# Patient Record
Sex: Male | Born: 1955 | Race: Black or African American | Hispanic: No | Marital: Married | Smoking: Former smoker
Health system: Southern US, Community
[De-identification: ages and names within clinical notes are randomized; demographics above are authoritative.]

## PROBLEM LIST (undated history)

## (undated) ENCOUNTER — Emergency Department (HOSPITAL_COMMUNITY): Admission: EM | Payer: Self-pay | Source: Home / Self Care

## (undated) DIAGNOSIS — E785 Hyperlipidemia, unspecified: Secondary | ICD-10-CM

## (undated) DIAGNOSIS — I10 Essential (primary) hypertension: Secondary | ICD-10-CM

## (undated) DIAGNOSIS — M1990 Unspecified osteoarthritis, unspecified site: Secondary | ICD-10-CM

## (undated) DIAGNOSIS — I639 Cerebral infarction, unspecified: Secondary | ICD-10-CM

---

## 2003-10-24 ENCOUNTER — Other Ambulatory Visit: Payer: Self-pay

## 2004-02-29 ENCOUNTER — Other Ambulatory Visit: Payer: Self-pay

## 2004-04-19 ENCOUNTER — Other Ambulatory Visit: Payer: Self-pay

## 2004-04-20 ENCOUNTER — Inpatient Hospital Stay: Payer: Self-pay | Admitting: Internal Medicine

## 2004-05-07 ENCOUNTER — Ambulatory Visit: Payer: Self-pay | Admitting: Internal Medicine

## 2004-12-27 ENCOUNTER — Emergency Department: Payer: Self-pay | Admitting: Unknown Physician Specialty

## 2005-07-04 ENCOUNTER — Inpatient Hospital Stay: Payer: Self-pay | Admitting: Unknown Physician Specialty

## 2005-07-04 ENCOUNTER — Other Ambulatory Visit: Payer: Self-pay

## 2006-01-08 ENCOUNTER — Emergency Department: Payer: Self-pay | Admitting: Emergency Medicine

## 2006-06-08 ENCOUNTER — Other Ambulatory Visit: Payer: Self-pay

## 2006-06-08 ENCOUNTER — Emergency Department: Payer: Self-pay | Admitting: General Practice

## 2006-06-28 ENCOUNTER — Ambulatory Visit: Payer: Self-pay | Admitting: Family Medicine

## 2006-08-02 ENCOUNTER — Ambulatory Visit: Payer: Self-pay | Admitting: General Surgery

## 2007-08-05 ENCOUNTER — Ambulatory Visit: Payer: Self-pay | Admitting: Urology

## 2008-01-15 ENCOUNTER — Other Ambulatory Visit: Payer: Self-pay

## 2008-01-15 ENCOUNTER — Ambulatory Visit: Payer: Self-pay | Admitting: Unknown Physician Specialty

## 2008-01-21 ENCOUNTER — Ambulatory Visit: Payer: Self-pay | Admitting: Unknown Physician Specialty

## 2008-08-20 ENCOUNTER — Ambulatory Visit: Payer: Self-pay | Admitting: Family Medicine

## 2009-07-18 ENCOUNTER — Emergency Department: Payer: Self-pay | Admitting: Internal Medicine

## 2009-11-19 ENCOUNTER — Ambulatory Visit: Payer: Self-pay | Admitting: Surgery

## 2009-11-26 ENCOUNTER — Ambulatory Visit: Payer: Self-pay | Admitting: Surgery

## 2010-03-25 ENCOUNTER — Ambulatory Visit: Payer: Self-pay | Admitting: Family Medicine

## 2010-07-27 ENCOUNTER — Emergency Department: Payer: Self-pay | Admitting: Emergency Medicine

## 2010-08-24 ENCOUNTER — Emergency Department (HOSPITAL_COMMUNITY)
Admission: EM | Admit: 2010-08-24 | Discharge: 2010-08-24 | Disposition: A | Discharge status: Home / Self Care | Payer: Self-pay | Attending: Emergency Medicine | Admitting: Emergency Medicine

## 2010-08-24 ENCOUNTER — Emergency Department (HOSPITAL_COMMUNITY): Payer: Self-pay

## 2010-08-24 ENCOUNTER — Encounter (HOSPITAL_COMMUNITY): Payer: Self-pay | Admitting: Radiology

## 2010-08-24 DIAGNOSIS — I10 Essential (primary) hypertension: Secondary | ICD-10-CM | POA: Insufficient documentation

## 2010-08-24 DIAGNOSIS — R1013 Epigastric pain: Secondary | ICD-10-CM | POA: Insufficient documentation

## 2010-08-24 DIAGNOSIS — K2970 Gastritis, unspecified, without bleeding: Secondary | ICD-10-CM | POA: Insufficient documentation

## 2010-08-24 DIAGNOSIS — Z8679 Personal history of other diseases of the circulatory system: Secondary | ICD-10-CM | POA: Insufficient documentation

## 2010-08-24 HISTORY — DX: Essential (primary) hypertension: I10

## 2010-08-24 LAB — POCT I-STAT, CHEM 8
BUN: 11 mg/dL (ref 6–23)
Calcium, Ion: 1.07 mmol/L — ABNORMAL LOW (ref 1.12–1.32)
Chloride: 101 mEq/L (ref 96–112)
Potassium: 4 mEq/L (ref 3.5–5.1)
Sodium: 138 mEq/L (ref 135–145)

## 2010-08-24 LAB — DIFFERENTIAL
Eosinophils Absolute: 0.2 10*3/uL (ref 0.0–0.7)
Lymphs Abs: 1.8 10*3/uL (ref 0.7–4.0)
Neutrophils Relative %: 40 % — ABNORMAL LOW (ref 43–77)

## 2010-08-24 LAB — POCT CARDIAC MARKERS
CKMB, poc: <1 ng/mL — ABNORMAL LOW (ref 1.0–8.0)
Myoglobin, poc: 54.8 ng/mL (ref 12–200)
Troponin i, poc: <0.05 ng/mL (ref 0.00–0.09)

## 2010-08-24 LAB — COMPREHENSIVE METABOLIC PANEL
AST: 30 U/L (ref 0–37)
Albumin: 3.9 g/dL (ref 3.5–5.2)
Calcium: 9.5 mg/dL (ref 8.4–10.5)
Creatinine, Ser: 1.11 mg/dL (ref 0.4–1.5)
GFR calc Af Amer: >60 mL/min (ref >60)
Total Protein: 6.8 g/dL (ref 6.0–8.3)

## 2010-08-24 LAB — URINALYSIS, ROUTINE W REFLEX MICROSCOPIC
Hgb urine dipstick: NEGATIVE
Specific Gravity, Urine: 1.015 (ref 1.005–1.030)
Urine Glucose, Fasting: NEGATIVE mg/dL

## 2010-08-24 LAB — CBC
MCV: 85.9 fL (ref 78.0–100.0)
Platelets: 243 10*3/uL (ref 150–400)
RBC: 5.18 MIL/uL (ref 4.22–5.81)
WBC: 4.2 10*3/uL (ref 4.0–10.5)

## 2010-08-24 MED ORDER — IOHEXOL 300 MG/ML  SOLN: 100.0000 mL | Freq: Once | INTRAMUSCULAR | Status: DC | PRN

## 2013-07-17 DIAGNOSIS — I639 Cerebral infarction, unspecified: Secondary | ICD-10-CM

## 2013-07-17 HISTORY — DX: Cerebral infarction, unspecified: I63.9

## 2014-07-03 DIAGNOSIS — I10 Essential (primary) hypertension: Secondary | ICD-10-CM | POA: Insufficient documentation

## 2014-07-03 DIAGNOSIS — E782 Mixed hyperlipidemia: Secondary | ICD-10-CM | POA: Insufficient documentation

## 2015-10-24 ENCOUNTER — Encounter: Payer: Self-pay | Admitting: *Deleted

## 2015-10-24 ENCOUNTER — Emergency Department
Admission: EM | Admit: 2015-10-24 | Discharge: 2015-10-24 | Disposition: A | Discharge status: Home / Self Care | Payer: PRIVATE HEALTH INSURANCE | Attending: Emergency Medicine | Admitting: Emergency Medicine

## 2015-10-24 DIAGNOSIS — X500XXA Overexertion from strenuous movement or load, initial encounter: Secondary | ICD-10-CM | POA: Insufficient documentation

## 2015-10-24 DIAGNOSIS — M25511 Pain in right shoulder: Secondary | ICD-10-CM | POA: Diagnosis present

## 2015-10-24 DIAGNOSIS — S46811A Strain of other muscles, fascia and tendons at shoulder and upper arm level, right arm, initial encounter: Secondary | ICD-10-CM | POA: Diagnosis not present

## 2015-10-24 DIAGNOSIS — Y990 Civilian activity done for income or pay: Secondary | ICD-10-CM | POA: Insufficient documentation

## 2015-10-24 DIAGNOSIS — I10 Essential (primary) hypertension: Secondary | ICD-10-CM | POA: Diagnosis not present

## 2015-10-24 DIAGNOSIS — Y9389 Activity, other specified: Secondary | ICD-10-CM | POA: Diagnosis not present

## 2015-10-24 DIAGNOSIS — Y929 Unspecified place or not applicable: Secondary | ICD-10-CM | POA: Insufficient documentation

## 2015-10-24 MED ORDER — NAPROXEN 500 MG PO TABS: 500.0000 mg | ORAL_TABLET | Freq: Two times a day (BID) | ORAL | Status: DC

## 2015-10-24 NOTE — ED Notes (Signed)
States he heard a pop in his right shoulder and is now having neck and shoulder tightness

## 2015-10-24 NOTE — ED Provider Notes (Signed)
[facility] Emergency Department Provider Note  ____________________________________________  Time seen: On arrival  I have reviewed the triage vital signs and the nursing notes.   HISTORY  Chief Complaint Neck Pain and Shoulder Pain    HPI Ronald Lang is a 60 y.o. male who presents with complaints of right shoulder and neck discomfort. 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 He complains the pain is worse when he rotates his head to the right area and he is able to move his shoulder without difficulty. No fevers or chills. No rash. No neuro deficits. No tingling or numbness.No chest pain    Past Medical History  Diagnosis Date  . Hypertension     There are no active problems to display for this patient.   History reviewed. No pertinent past surgical history.  Current Outpatient Rx  Name  Route  Sig  Dispense  Refill  . naproxen (NAPROSYN) 500 MG tablet   Oral   Take 1 tablet (500 mg total) by mouth 2 (two) times daily with a meal.   20 tablet   2     Allergies Review of patient's allergies indicates no known allergies.  History reviewed. No pertinent family history.  Social History Social History  Substance Use Topics  . Smoking status: None  . Smokeless tobacco: None  . Alcohol Use: None    Review of Systems  Constitutional: Negative for fever.      Musculoskeletal: Negative for back pain. Skin: Negative for rash. Neurological: Negative for headaches or focal weakness   ____________________________________________   PHYSICAL EXAM:  VITAL SIGNS: ED Triage Vitals  Enc Vitals Group     BP 10/24/15 0923 141/75 mmHg     Pulse Rate 10/24/15 0923 67     Resp 10/24/15 0923 18     Temp 10/24/15 0923 98 F (36.7 C)     Temp Source 10/24/15 0923 Oral     SpO2 10/24/15 0923 97 %     Weight 10/24/15 0923 209 lb (94.802 kg)     Height 10/24/15 0923 5\' 11"  (1.803 m)     Head  Cir --      Peak Flow --      Pain Score 10/24/15 0924 8     Pain Loc --      Pain Edu? --      Excl. in [place]? --      Constitutional: Alert and oriented. Well appearing and in no distress. Eyes: Conjunctivae are normal.  ENT   Head: Normocephalic and atraumatic.   Mouth/Throat: Mucous membranes are moist. Cardiovascular: Normal rate, regular rhythm.  Respiratory: Normal respiratory effort without tachypnea nor retractions.  Gastrointestinal: Soft and non-tender in all quadrants. No distention. There is no CVA tenderness. Musculoskeletal: Nontender with normal range of motion in all extremities. Patient with mild to moderate tenderness of the right central trapezius muscle consistent with muscle spasm/strain. Range of motion of the shoulder without difficulty. Neurologic:  Normal speech and language. No gross focal neurologic deficits are appreciated. Skin:  Skin is warm, dry and intact. No rash noted. Psychiatric: Mood and affect are normal. Patient exhibits appropriate insight and judgment.  ____________________________________________    LABS (pertinent positives/negatives)  Labs Reviewed - No data to display  ____________________________________________     ____________________________________________    RADIOLOGY I have personally reviewed any xrays that were ordered on this patient: None  ____________________________________________   PROCEDURES  Procedure(s) performed:  none   ____________________________________________   INITIAL IMPRESSION / ASSESSMENT AND PLAN / ED COURSE  Pertinent labs & imaging results that were available during my care of the patient were reviewed by me and considered in my medical decision making (see chart for details).  Patient overall well-appearing. Exam is most consistent with trapezius muscle strain. Recommend heating pad, NSAIDs. If no improvement he will follow up with orthopedics in 1 week. If worsening symptoms he  is to return to the emergency department  ____________________________________________   FINAL CLINICAL IMPRESSION(S) / ED DIAGNOSES  Final diagnoses:  Trapezius muscle strain, right, initial encounter     Lavonia Drafts, MD 10/24/15 1519

## 2015-10-24 NOTE — Discharge Instructions (Signed)

## 2016-05-03 ENCOUNTER — Emergency Department: Payer: No Typology Code available for payment source

## 2016-05-03 ENCOUNTER — Emergency Department
Admission: EM | Admit: 2016-05-03 | Discharge: 2016-05-03 | Disposition: A | Discharge status: Home / Self Care | Payer: No Typology Code available for payment source | Attending: Emergency Medicine | Admitting: Emergency Medicine

## 2016-05-03 ENCOUNTER — Encounter: Payer: Self-pay | Admitting: *Deleted

## 2016-05-03 DIAGNOSIS — Y92410 Unspecified street and highway as the place of occurrence of the external cause: Secondary | ICD-10-CM | POA: Diagnosis not present

## 2016-05-03 DIAGNOSIS — S134XXA Sprain of ligaments of cervical spine, initial encounter: Secondary | ICD-10-CM | POA: Insufficient documentation

## 2016-05-03 DIAGNOSIS — Y939 Activity, unspecified: Secondary | ICD-10-CM | POA: Diagnosis not present

## 2016-05-03 DIAGNOSIS — I10 Essential (primary) hypertension: Secondary | ICD-10-CM | POA: Diagnosis not present

## 2016-05-03 DIAGNOSIS — S199XXA Unspecified injury of neck, initial encounter: Secondary | ICD-10-CM | POA: Diagnosis present

## 2016-05-03 DIAGNOSIS — Y999 Unspecified external cause status: Secondary | ICD-10-CM | POA: Insufficient documentation

## 2016-05-03 MED ORDER — NAPROXEN 500 MG PO TABS: 500.0000 mg | ORAL_TABLET | Freq: Two times a day (BID) | ORAL | 2 refills | Status: DC
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
## 2016-05-03 NOTE — ED Notes (Signed)
Pt states understanding of discharge instructions. NAD noted at this time.
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
## 2016-07-15 ENCOUNTER — Emergency Department
Admission: EM | Admit: 2016-07-15 | Discharge: 2016-07-15 | Disposition: A | Discharge status: Home / Self Care | Payer: PRIVATE HEALTH INSURANCE | Attending: Student in an Organized Health Care Education/Training Program | Admitting: Student in an Organized Health Care Education/Training Program

## 2016-07-15 ENCOUNTER — Emergency Department: Payer: PRIVATE HEALTH INSURANCE

## 2016-07-15 DIAGNOSIS — I10 Essential (primary) hypertension: Secondary | ICD-10-CM | POA: Insufficient documentation

## 2016-07-15 DIAGNOSIS — Z79899 Other long term (current) drug therapy: Secondary | ICD-10-CM | POA: Diagnosis not present

## 2016-07-15 DIAGNOSIS — N5089 Other specified disorders of the male genital organs: Secondary | ICD-10-CM

## 2016-07-15 DIAGNOSIS — N50811 Right testicular pain: Secondary | ICD-10-CM | POA: Diagnosis present

## 2016-07-15 LAB — URINALYSIS, COMPLETE (UACMP) WITH MICROSCOPIC
BACTERIA UA: NONE SEEN
Bilirubin Urine: NEGATIVE
Glucose, UA: NEGATIVE mg/dL
Ketones, ur: NEGATIVE mg/dL
Nitrite: NEGATIVE
Protein, ur: NEGATIVE mg/dL
SPECIFIC GRAVITY, URINE: 1.015 (ref 1.005–1.030)
SQUAMOUS EPITHELIAL / LPF: NONE SEEN
pH: 5 (ref 5.0–8.0)

## 2016-07-15 LAB — CBC
HEMATOCRIT: 44.4 % (ref 40.0–52.0)
HEMOGLOBIN: 15.3 g/dL (ref 13.0–18.0)
MCH: 30.8 pg (ref 26.0–34.0)
MCHC: 34.5 g/dL (ref 32.0–36.0)
MCV: 89.1 fL (ref 80.0–100.0)
Platelets: 259 10*3/uL (ref 150–440)
RBC: 4.99 MIL/uL (ref 4.40–5.90)
RDW: 13.5 % (ref 11.5–14.5)
WBC: 9.5 10*3/uL (ref 3.8–10.6)

## 2016-07-15 LAB — COMPREHENSIVE METABOLIC PANEL
ALBUMIN: 4.1 g/dL (ref 3.5–5.0)
ALK PHOS: 51 U/L (ref 38–126)
ALT: 14 U/L — AB (ref 17–63)
AST: 26 U/L (ref 15–41)
Anion gap: 8 (ref 5–15)
BILIRUBIN TOTAL: 1.9 mg/dL — AB (ref 0.3–1.2)
BUN: 10 mg/dL (ref 6–20)
CALCIUM: 9.2 mg/dL (ref 8.9–10.3)
CO2: 29 mmol/L (ref 22–32)
CREATININE: 0.96 mg/dL (ref 0.61–1.24)
Chloride: 99 mmol/L — ABNORMAL LOW (ref 101–111)
GFR calc Af Amer: >60 mL/min (ref >60)
GFR calc non Af Amer: >60 mL/min (ref >60)
GLUCOSE: 91 mg/dL (ref 65–99)
Potassium: 3.6 mmol/L (ref 3.5–5.1)
SODIUM: 136 mmol/L (ref 135–145)
TOTAL PROTEIN: 7.1 g/dL (ref 6.5–8.1)

## 2016-07-15 LAB — LACTATE DEHYDROGENASE: LDH: 177 U/L (ref 98–192)

## 2016-07-15 MED ORDER — HYDROCODONE-ACETAMINOPHEN 5-325 MG PO TABS
1.0000 | ORAL_TABLET | ORAL | 0 refills | Status: DC | PRN
Start: 2016-07-15 — End: 2016-08-08

## 2016-07-15 MED ORDER — LEVOFLOXACIN 500 MG PO TABS: 500.0000 mg | ORAL_TABLET | Freq: Every day | ORAL | 0 refills | Status: AC

## 2016-07-15 MED ORDER — LEVOFLOXACIN 500 MG PO TABS
500.0000 mg | ORAL_TABLET | Freq: Once | ORAL | Status: AC
  Administered 2016-07-15: 500 mg via ORAL
  Filled 2016-07-15: qty 1

## 2016-07-15 MED ORDER — IBUPROFEN 600 MG PO TABS
600.0000 mg | ORAL_TABLET | Freq: Once | ORAL | Status: AC
  Administered 2016-07-15: 600 mg via ORAL
  Filled 2016-07-15: qty 1

## 2016-07-15 NOTE — ED Notes (Signed)
ED Provider at bedside. 

## 2016-07-15 NOTE — ED Provider Notes (Signed)
[facility] Emergency Department Provider Note    First MD Initiated Contact with Patient 07/15/16 1425     (approximate)  I have reviewed the triage vital signs and the nursing notes.   HISTORY  Chief Complaint Testicle Pain    HPI JOZEF ZAHNOW is a 60 y.o. male presents with 2 days of right-sided testicular pain and swelling. Denies any dysuria. No measured fevers. Denies any trauma. Patient states that essentially active with his wife. Denies any urethral discharge.  Denies any hematuria.   Past Medical History:  Diagnosis Date  . Hypertension    [place]: mother with unknown cancer No past surgical history  There are no active problems to display for this patient.     Prior to Admission medications   Medication Sig Start Date End Date Taking? Authorizing Provider  naproxen (NAPROSYN) 500 MG tablet Take 1 tablet (500 mg total) by mouth 2 (two) times daily with a meal. 05/03/16   Lavonia Drafts, MD    Allergies Patient has no known allergies.    Social History Social History  Substance Use Topics  . Smoking status: Not on file  . Smokeless tobacco: Not on file  . Alcohol use Not on file    Review of Systems Patient denies headaches, rhinorrhea, blurry vision, numbness, shortness of breath, chest pain, edema, cough, abdominal pain, nausea, vomiting, diarrhea, dysuria, fevers, rashes or hallucinations unless otherwise stated above in HPI. ____________________________________________   PHYSICAL EXAM:  VITAL SIGNS: Vitals:   07/15/16 1119  BP: (!) 142/73  Pulse: 72  Resp: 18  Temp: 98.7 F (37.1 C)    Constitutional: Alert and oriented. Well appearing and in no acute distress. Eyes: Conjunctivae are normal. PERRL. EOMI. Head: Atraumatic. Nose: No congestion/rhinnorhea. Mouth/Throat: Mucous membranes are moist.  Oropharynx non-erythematous. Neck: No stridor. Painless ROM. No cervical spine tenderness to  palpation Hematological/Lymphatic/Immunilogical: No cervical lymphadenopathy. Cardiovascular: Normal rate, regular rhythm. Grossly normal heart sounds.  Good peripheral circulation. Respiratory: Normal respiratory effort.  No retractions. Lungs CTAB. Gastrointestinal: Soft and nontender. No distention. No abdominal bruits. No CVA tenderness. Genitourinary: right testicle is enlarged with ttp. No erythema Musculoskeletal: No lower extremity tenderness nor edema.  No joint effusions. Neurologic:  Normal speech and language. No gross focal neurologic deficits are appreciated. No gait instability. Skin:  Skin is warm, dry and intact. No rash noted. Psychiatric: Mood and affect are normal. Speech and behavior are normal.  ____________________________________________   LABS (all labs ordered are listed, but only abnormal results are displayed)  Results for orders placed or performed during the hospital encounter of 07/15/16 (from the past 24 hour(s))  CBC     Status: None   Collection Time: 07/15/16 11:25 AM  Result Value Ref Range   WBC 9.5 3.8 - 10.6 K/uL   RBC 4.99 4.40 - 5.90 MIL/uL   Hemoglobin 15.3 13.0 - 18.0 g/dL   HCT 44.4 40.0 - 52.0 %   MCV 89.1 80.0 - 100.0 fL   MCH 30.8 26.0 - 34.0 pg   MCHC 34.5 32.0 - 36.0 g/dL   RDW 13.5 11.5 - 14.5 %   Platelets 259 150 - 440 K/uL  Comprehensive metabolic panel     Status: Abnormal   Collection Time: 07/15/16 11:25 AM  Result Value Ref Range   Sodium 136 135 - 145 mmol/L   Potassium 3.6 3.5 - 5.1 mmol/L   Chloride 99 (L) 101 - 111 mmol/L   CO2 29 22 - 32 mmol/L  Glucose, Bld 91 65 - 99 mg/dL   BUN 10 6 - 20 mg/dL   Creatinine, Ser 0.96 0.61 - 1.24 mg/dL   Calcium 9.2 8.9 - 10.3 mg/dL   Total Protein 7.1 6.5 - 8.1 g/dL   Albumin 4.1 3.5 - 5.0 g/dL   AST 26 15 - 41 U/L   ALT 14 (L) 17 - 63 U/L   Alkaline Phosphatase 51 38 - 126 U/L   Total Bilirubin 1.9 (H) 0.3 - 1.2 mg/dL   GFR calc non Af Amer >60 >60 mL/min   GFR calc Af  Amer >60 >60 mL/min   Anion gap 8 5 - 15  Urinalysis, Complete w Microscopic     Status: Abnormal   Collection Time: 07/15/16 11:25 AM  Result Value Ref Range   Color, Urine YELLOW (A) YELLOW   APPearance CLEAR (A) CLEAR   Specific Gravity, Urine 1.015 1.005 - 1.030   pH 5.0 5.0 - 8.0   Glucose, UA NEGATIVE NEGATIVE mg/dL   Hgb urine dipstick SMALL (A) NEGATIVE   Bilirubin Urine NEGATIVE NEGATIVE   Ketones, ur NEGATIVE NEGATIVE mg/dL   Protein, ur NEGATIVE NEGATIVE mg/dL   Nitrite NEGATIVE NEGATIVE   Leukocytes, UA MODERATE (A) NEGATIVE   RBC / HPF 0-5 0 - 5 RBC/hpf   WBC, UA 6-30 0 - 5 WBC/hpf   Bacteria, UA NONE SEEN NONE SEEN   Squamous Epithelial / LPF NONE SEEN NONE SEEN   Mucous PRESENT    ____________________________________________  ____________________________________________  RADIOLOGY  I personally reviewed all radiographic images ordered to evaluate for the above acute complaints and reviewed radiology reports and findings.  These findings were personally discussed with the patient.  Please see medical record for radiology report. ____________________________________________   PROCEDURES  Procedure(s) performed:  Procedures    Critical Care performed: no ____________________________________________   INITIAL IMPRESSION / ASSESSMENT AND PLAN / ED COURSE  Pertinent labs & imaging results that were available during my care of the patient were reviewed by me and considered in my medical decision making (see chart for details).  DDX: mass, torsion, epidydimytis  ZECHERIAH HILLIER is a 60 y.o. who presents to the ED with right testicular pain and swelling. Ultrasound ordered out of triage due to concern for torsion shows evidence of right testicular mass. Mass effect is not within the testicle but just inferior. There is some clinical signs of epididymitis but the heterogenous mass more concerning for neoplasm according to radiology. Will consult  urology.  Clinical Course as of Jul 15 1432  Sat Jul 15, 2016  1432 I just spoke with Dr. Alyson Ingles of urology regarding the results of the [place].  He has requested AFP, LDH and HCG labs to be drawn and will help arrange close outpatient follow up.  [PR]    Clinical Course User Index [PR] Merlyn Lot, MD     ____________________________________________   FINAL CLINICAL IMPRESSION(S) / ED DIAGNOSES  Final diagnoses:  Testicular pain, right      NEW MEDICATIONS STARTED DURING THIS VISIT:  New Prescriptions   No medications on file     Note:  This document was prepared using Dragon voice recognition software and may include unintentional dictation errors.    Merlyn Lot, MD 07/15/16 [phone]

## 2016-07-15 NOTE — ED Notes (Signed)
Right testicle enlarged. Otherwise, all systems WNL.

## 2016-07-15 NOTE — Discharge Instructions (Signed)
Please follow up with Dr. Alyson Ingles (Urologist) by calling the office on Tuesday for early appointment next week.  Take antibiotics as directed.

## 2016-07-15 NOTE — ED Triage Notes (Signed)
States right sided testicle pain for 2 days, denies any problems urinating, denies any trauma, states some swelling, deneis any discoloration

## 2016-07-17 LAB — BETA HCG QUANT (REF LAB): Beta hCG, Tumor Marker: <1 m[IU]/mL (ref 0–3)

## 2016-07-17 LAB — URINE CULTURE: Culture: 60000 — AB

## 2016-07-17 LAB — AFP TUMOR MARKER: AFP-Tumor Marker: 2.4 ng/mL (ref 0.0–8.3)

## 2016-07-19 ENCOUNTER — Encounter: Payer: Self-pay | Admitting: Urology

## 2016-07-19 ENCOUNTER — Ambulatory Visit: Payer: Self-pay | Admitting: Urology

## 2016-07-20 ENCOUNTER — Other Ambulatory Visit: Payer: Self-pay | Admitting: Radiology

## 2016-07-20 ENCOUNTER — Encounter: Payer: Self-pay | Admitting: Urology

## 2016-07-20 ENCOUNTER — Ambulatory Visit (INDEPENDENT_AMBULATORY_CARE_PROVIDER_SITE_OTHER): Payer: PRIVATE HEALTH INSURANCE | Admitting: Urology

## 2016-07-20 VITALS — BP 132/73 | HR 65 | Ht 71.0 in | Wt 201.1 lb

## 2016-07-20 DIAGNOSIS — N509 Disorder of male genital organs, unspecified: Secondary | ICD-10-CM | POA: Diagnosis not present

## 2016-07-20 DIAGNOSIS — N5089 Other specified disorders of the male genital organs: Secondary | ICD-10-CM

## 2016-07-20 NOTE — Progress Notes (Signed)
07/20/2016 10:58 AM   Ronald Lang May 27, 1956 [id number]  Referring provider: [facility] [address] [place], [place] [address]  Chief Complaint  Patient presents with  . Testicle Pain    HPI: 61 year old male previously seen in the emergency room on 07/15/16 with several days of right testicular pain and swelling. He denies any precipitating trauma or associated symptoms including any urinary issues.  Scrotal ultrasound shows a concerning heterogeneous area measuring 4.2 x 2.9 x 3.4 cm within the testicle itself concerning for possible neoplasm. Tumor markers were obtained in the emergency room including AFP, beta-hCG, and LDH which are negative.  He denies a personal history of epididymoorchitis or urinary tract infections. He does not do testicular self exams regularly.  He denies any weight loss or bone pain. No night sweats.  The pain in his right testicle has essentially subsided and he now has only a residual mass.  Incidentally, urine culture from the emergency room grew 60,000 colonies of enterococcus. He was treated with Levaquin appropriately.  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  He takes no blood thinners or antiplatelet therapy.   PMH: Past Medical History:  Diagnosis Date  . Hypertension     Surgical History: No past surgical history on file.  Home Medications:  Allergies as of 07/20/2016   No Known Allergies     Medication List       Accurate as of 07/20/16 10:58 AM. Always use your most recent med list.          HYDROcodone-acetaminophen 5-325 MG tablet Commonly known as:  NORCO Take 1 tablet by mouth every 4 (four) hours as needed for moderate pain.   levofloxacin 500 MG tablet Commonly known as:  LEVAQUIN Take 1 tablet (500 mg total) by mouth daily.   naproxen 500 MG tablet Commonly known as:  NAPROSYN Take 1 tablet (500 mg total) by mouth 2 (two) times daily with a meal.       Allergies: No Known  Allergies  Family History: Family History  Problem Relation Age of Onset  . Prostate cancer Neg Hx   . Kidney cancer Neg Hx   . Bladder Cancer Neg Hx     Social History:  reports that he has been smoking.  He has never used smokeless tobacco. He reports that he does not drink alcohol or use drugs.  ROS: UROLOGY Frequent Urination?: No Hard to postpone urination?: No Burning/pain with urination?: No Get up at night to urinate?: No Leakage of urine?: No Urine stream starts and stops?: No Trouble starting stream?: No Do you have to strain to urinate?: No Blood in urine?: No Urinary tract infection?: No Sexually transmitted disease?: No Injury to kidneys or bladder?: No Painful intercourse?: No Weak stream?: No Erection problems?: No Penile pain?: No  Gastrointestinal Nausea?: No Vomiting?: No Indigestion/heartburn?: No Diarrhea?: No Constipation?: No  Constitutional Fever: No Night sweats?: No Weight loss?: Yes Fatigue?: No  Skin Skin rash/lesions?: No Itching?: No  Eyes Blurred vision?: No Double vision?: No  Ears/Nose/Throat Sore throat?: No Sinus problems?: No  Hematologic/Lymphatic Swollen glands?: No Easy bruising?: No  Cardiovascular Leg swelling?: No Chest pain?: No  Respiratory Cough?: No Shortness of breath?: No  Endocrine Excessive thirst?: No  Musculoskeletal Back pain?: No Joint pain?: No  Neurological Headaches?: No Dizziness?: No  Psychologic Depression?: No Anxiety?: No  Physical Exam: BP 132/73 (BP Location: Left Arm, Patient Position: Sitting, Cuff Size: Large)   Pulse 65  Ht 5\' 11"  (1.803 m)   Wt 201 lb 1.6 oz (91.2 kg)   BMI 28.05 kg/m   Constitutional:  Alert and oriented, No acute distress.  Accompanied by wife today. HEENT: [place] AT, moist mucus membranes.  Trachea midline, no masses. Cardiovascular: No clubbing, cyanosis, or edema. RRR. Respiratory: Normal respiratory effort, no increased work of breathing.  CTAB. GI: Abdomen is soft, nontender, nondistended, no abdominal masses GU: Circumcised phallus with orthotopic meatus. No penile discharge appreciated. Bilateral descended testicles, no scrotal skin changes or edema.  Left testicle and epididymis normal. Right testicle firm, nodular, with intratesticular lesion highly concerning for neoplasm, relatively nontender on exam. Skin: No rashes, bruises or suspicious lesions. Lymph: No cervical or inguinal adenopathy. Neurologic: Grossly intact, no focal deficits, moving all 4 extremities. Psychiatric: Normal mood and affect.  Laboratory Data: Lab Results  Component Value Date   WBC 9.5 07/15/2016   HGB 15.3 07/15/2016   HCT 44.4 07/15/2016   MCV 89.1 07/15/2016   PLT 259 07/15/2016    Lab Results  Component Value Date   CREATININE 0.96 07/15/2016   Component     Latest Ref Rng & Units 07/15/2016  Color, Urine     YELLOW YELLOW (A)  Appearance     CLEAR CLEAR (A)  Specific Gravity, Urine     1.005 - 1.030 1.015  pH     5.0 - 8.0 5.0  Glucose     NEGATIVE mg/dL NEGATIVE  Hgb urine dipstick     NEGATIVE SMALL (A)  Bilirubin Urine     NEGATIVE NEGATIVE  Ketones, ur     NEGATIVE mg/dL NEGATIVE  Protein     NEGATIVE mg/dL NEGATIVE  Nitrite     NEGATIVE NEGATIVE  Leukocytes, UA     NEGATIVE MODERATE (A)  RBC / HPF     0 - 5 RBC/hpf 0-5  WBC, UA     0 - 5 WBC/hpf 6-30  Bacteria, UA     NONE SEEN NONE SEEN  Squamous Epithelial / LPF     NONE SEEN NONE SEEN  Mucous      PRESENT   Urinalysis    Component Value Date/Time   COLORURINE YELLOW (A) 07/15/2016 1125   APPEARANCEUR CLEAR (A) 07/15/2016 1125   LABSPEC 1.015 07/15/2016 1125   PHURINE 5.0 07/15/2016 1125   GLUCOSEU NEGATIVE 07/15/2016 1125   HGBUR SMALL (A) 07/15/2016 1125   BILIRUBINUR NEGATIVE 07/15/2016 1125   KETONESUR NEGATIVE 07/15/2016 1125   PROTEINUR NEGATIVE 07/15/2016 1125   UROBILINOGEN 1.0 08/24/2010 0956   NITRITE NEGATIVE 07/15/2016 1125    LEUKOCYTESUR MODERATE (A) 07/15/2016 1125   Component     Latest Ref Rng & Units 07/15/2016  Specimen Description      URINE, CLEAN CATCH  Special Requests      NONE  Culture      60,000 COLONIES/mL ENTEROCOCCUS FAECALIS (A)  Report Status      07/17/2016 FINAL  Organism ID, Bacteria      ENTEROCOCCUS FAECALIS (A)    Pertinent Imaging: CLINICAL DATA:  Right testicular pain for 2 days with swelling  EXAM: ULTRASOUND OF SCROTUM  TECHNIQUE: Complete ultrasound examination of the testicles, epididymis, and other scrotal structures was performed.  COMPARISON:  None.  FINDINGS: Right testicle  Measurements: 3.7 x 2.3 x 3.1 cm. No intra testicular mass or microlithiasis visualized. Inferior to the right testicle within the right scrotum is a 4.2 x 2.9 x 3.4 cm heterogeneous area with color flow, the  area pain and swelling.  Left testicle  Measurements: 4.2 x 2.8 x 2.4 cm. No mass or microlithiasis visualized.  Right epididymis:  Normal in size and appearance.  Left epididymis:  Normal in size and appearance.  Hydrocele:  There are small bilateral hydroceles.  Varicocele:  None visualized.  IMPRESSION: Inferior to the right testicle within the right scrotum is a 4.2 x 2.9 x 3.4 cm masslike area with color flow, the area pain and swelling. Favor neoplasm, less likely hematoma given the extensive color flow.   Electronically Signed   By: Abelardo Diesel M.D.   On: 07/15/2016 12:19  Scrotal ultrasound personally reviewed today.  Assessment & Plan:  61 year old male with right testicular pain found to have a 4.2 cm lesion within the testicle highly concerning for neoplasm. Differential diagnosis includes focal epididymoorchitis, trouble with hemorrhage although these 2 etiologies are less likely. He did have a positive urine culture which was adequately treated, unclear if this was contributory to his symptoms.  1. Mass of right testicle  Given the  high index of suspicion for testicular cancer, I recommended proceeding with right inguinal approach radical orchiectomy. I explained that with possible that the lesion may represent alternative pathology is as above.  We reviewed the risks and benefits of orchiectomy. He was offered a prosthesis and declined. He is not interested in sperm banking at his age. Risks including bleeding, infection, phantom testicular pain, chronic pain, hematoma, discomfort were all discussed. All of his questions were answered.  We'll go ahead and arrange for staging in the form of chest x-ray and CT abdomen pelvis.  - CT Abdomen Pelvis W Contrast; Future - Chest 1 View; Future  Schedule right radical inguinal orchiectomy.  Hollice Espy, MD  [facility] [address], [place], [place] [address] [phone]

## 2016-07-21 ENCOUNTER — Encounter
Admission: RE | Admit: 2016-07-21 | Discharge: 2016-07-21 | Disposition: A | Discharge status: Home / Self Care | Payer: PRIVATE HEALTH INSURANCE | Source: Ambulatory Visit | Attending: Urology | Admitting: Urology

## 2016-07-21 ENCOUNTER — Other Ambulatory Visit: Payer: Self-pay | Admitting: Radiology

## 2016-07-21 DIAGNOSIS — N4341 Spermatocele of epididymis, single: Secondary | ICD-10-CM | POA: Diagnosis not present

## 2016-07-21 DIAGNOSIS — E785 Hyperlipidemia, unspecified: Secondary | ICD-10-CM | POA: Diagnosis not present

## 2016-07-21 DIAGNOSIS — Z7982 Long term (current) use of aspirin: Secondary | ICD-10-CM | POA: Diagnosis not present

## 2016-07-21 DIAGNOSIS — Z79899 Other long term (current) drug therapy: Secondary | ICD-10-CM | POA: Diagnosis not present

## 2016-07-21 DIAGNOSIS — I69328 Other speech and language deficits following cerebral infarction: Secondary | ICD-10-CM | POA: Diagnosis not present

## 2016-07-21 DIAGNOSIS — N509 Disorder of male genital organs, unspecified: Secondary | ICD-10-CM | POA: Diagnosis present

## 2016-07-21 DIAGNOSIS — I10 Essential (primary) hypertension: Secondary | ICD-10-CM | POA: Diagnosis not present

## 2016-07-21 HISTORY — DX: Hyperlipidemia, unspecified: E78.5

## 2016-07-21 HISTORY — DX: Unspecified osteoarthritis, unspecified site: M19.90

## 2016-07-21 HISTORY — DX: Cerebral infarction, unspecified: I63.9

## 2016-07-21 NOTE — Patient Instructions (Signed)
  Your procedure is scheduled on: July 24, 2016 (Monday) Report to Same Day Surgery 2nd floor medical mall [facility] Entrance-take elevator on left to 2nd floor.  Check in with surgery information desk.) Arrival Time 8:15 am   Remember: Instructions that are not followed completely may result in serious medical risk, up to and including death, or upon the discretion of your surgeon and anesthesiologist your surgery may need to be rescheduled.    _x___ 1. Do not eat food or drink liquids after midnight. No gum chewing or hard candies.     __x__ 2. No Alcohol for 24 hours before or after surgery.   __x__3. No Smoking for 24 prior to surgery.   ____  4. Bring all medications with you on the day of surgery if instructed.    __x__ 5. Notify your doctor if there is any change in your medical condition     (cold, fever, infections).     Do not wear jewelry, make-up, hairpins, clips or nail polish.  Do not wear lotions, powders, or perfumes. You may wear deodorant.  Do not shave 48 hours prior to surgery. Men may shave face and neck.  Do not bring valuables to the hospital.    [facility] is not responsible for any belongings or valuables.               Contacts, dentures or bridgework may not be worn into surgery.  Leave your suitcase in the car. After surgery it may be brought to your room.  For patients admitted to the hospital, discharge time is determined by your treatment team.   Patients discharged the day of surgery will not be allowed to drive home.  You will need someone to drive you home and stay with you the night of your procedure.    Please read over the following fact sheets that you were given:   [facility] Preparing for Surgery and or MRSA Information   ___ Take these medicines the morning of surgery with A SIP OF WATER:    1.   2.  3.  4.  5.  6.  ____Fleets enema or Magnesium Citrate as directed.   ___ Use CHG Soap or sage wipes as directed on instruction  sheet   ____ Use inhalers on the day of surgery and bring to hospital day of surgery  ____ Stop metformin 2 days prior to surgery    ____ Take 1/2 of usual insulin dose the night before surgery and none on the morning of           surgery.   _x___ Stop Aspirin, Coumadin, Pllavix ,Eliquis, Effient, or Pradaxa (NO ASPIRIN)  x__ Stop Anti-inflammatories such as Advil, Aleve, Ibuprofen, Motrin, Naproxen,          Naprosyn, Goodies powders or aspirin products. Ok to take Tylenol.   ____ Stop supplements until after surgery.    ____ Bring C-Pap to the hospital.

## 2016-07-23 MED ORDER — CEFAZOLIN SODIUM-DEXTROSE 2-4 GM/100ML-% IV SOLN
2.0000 g | INTRAVENOUS | Status: AC
  Administered 2016-07-24: 2 g via INTRAVENOUS

## 2016-07-24 ENCOUNTER — Encounter
Admission: RE | Disposition: A | Discharge status: Home / Self Care | Payer: Self-pay | Source: Ambulatory Visit | Attending: Urology

## 2016-07-24 ENCOUNTER — Ambulatory Visit
Admission: RE | Admit: 2016-07-24 | Discharge: 2016-07-24 | Disposition: A | Discharge status: Home / Self Care | Payer: PRIVATE HEALTH INSURANCE | Source: Ambulatory Visit | Attending: Urology | Admitting: Urology

## 2016-07-24 ENCOUNTER — Ambulatory Visit: Payer: PRIVATE HEALTH INSURANCE | Admitting: Anesthesiology

## 2016-07-24 DIAGNOSIS — Z7982 Long term (current) use of aspirin: Secondary | ICD-10-CM | POA: Insufficient documentation

## 2016-07-24 DIAGNOSIS — Z79899 Other long term (current) drug therapy: Secondary | ICD-10-CM | POA: Insufficient documentation

## 2016-07-24 DIAGNOSIS — D4011 Neoplasm of uncertain behavior of right testis: Secondary | ICD-10-CM | POA: Diagnosis not present

## 2016-07-24 DIAGNOSIS — N4341 Spermatocele of epididymis, single: Secondary | ICD-10-CM | POA: Insufficient documentation

## 2016-07-24 DIAGNOSIS — I10 Essential (primary) hypertension: Secondary | ICD-10-CM | POA: Insufficient documentation

## 2016-07-24 DIAGNOSIS — E785 Hyperlipidemia, unspecified: Secondary | ICD-10-CM | POA: Insufficient documentation

## 2016-07-24 DIAGNOSIS — I69328 Other speech and language deficits following cerebral infarction: Secondary | ICD-10-CM | POA: Insufficient documentation

## 2016-07-24 DIAGNOSIS — N5089 Other specified disorders of the male genital organs: Secondary | ICD-10-CM

## 2016-07-24 HISTORY — PX: ORCHIECTOMY: SHX2116

## 2016-07-24 SURGERY — ORCHIECTOMY
Anesthesia: General | Laterality: Right | Wound class: Clean

## 2016-07-24 MED ORDER — BUPIVACAINE HCL 0.5 % IJ SOLN
INTRAMUSCULAR | Status: DC | PRN
  Administered 2016-07-24: 10 mL

## 2016-07-24 MED ORDER — ACETAMINOPHEN 10 MG/ML IV SOLN
INTRAVENOUS | Status: DC | PRN
Start: 2016-07-24 — End: 2016-07-24
  Administered 2016-07-24: 1000 mg via INTRAVENOUS

## 2016-07-24 MED ORDER — LACTATED RINGERS IV SOLN
INTRAVENOUS | Status: DC
  Administered 2016-07-24 (×2): via INTRAVENOUS

## 2016-07-24 MED ORDER — HYDROCODONE-ACETAMINOPHEN 5-325 MG PO TABS
ORAL_TABLET | ORAL | Status: AC
  Administered 2016-07-24: 1 via ORAL
  Filled 2016-07-24: qty 1

## 2016-07-24 MED ORDER — MIDAZOLAM HCL 2 MG/2ML IJ SOLN
INTRAMUSCULAR | Status: AC
  Filled 2016-07-24: qty 2

## 2016-07-24 MED ORDER — EPHEDRINE 5 MG/ML INJ
INTRAVENOUS | Status: AC
  Filled 2016-07-24: qty 10

## 2016-07-24 MED ORDER — ACETAMINOPHEN 10 MG/ML IV SOLN
INTRAVENOUS | Status: AC
  Filled 2016-07-24: qty 100

## 2016-07-24 MED ORDER — SUGAMMADEX SODIUM 200 MG/2ML IV SOLN
INTRAVENOUS | Status: AC
  Filled 2016-07-24: qty 2

## 2016-07-24 MED ORDER — DOCUSATE SODIUM 100 MG PO CAPS: 100.0000 mg | ORAL_CAPSULE | Freq: Two times a day (BID) | ORAL | 0 refills | Status: DC

## 2016-07-24 MED ORDER — LIDOCAINE HCL 2 % EX GEL
CUTANEOUS | Status: AC
  Filled 2016-07-24: qty 5

## 2016-07-24 MED ORDER — ROCURONIUM BROMIDE 100 MG/10ML IV SOLN
INTRAVENOUS | Status: DC | PRN
  Administered 2016-07-24: 10 mg via INTRAVENOUS
  Administered 2016-07-24: 40 mg via INTRAVENOUS

## 2016-07-24 MED ORDER — ONDANSETRON HCL 4 MG/2ML IJ SOLN
INTRAMUSCULAR | Status: AC
  Filled 2016-07-24: qty 2

## 2016-07-24 MED ORDER — FENTANYL CITRATE (PF) 100 MCG/2ML IJ SOLN
INTRAMUSCULAR | Status: AC
  Filled 2016-07-24: qty 2

## 2016-07-24 MED ORDER — FAMOTIDINE 20 MG PO TABS
ORAL_TABLET | ORAL | Status: AC
  Administered 2016-07-24: 20 mg via ORAL
  Filled 2016-07-24: qty 1

## 2016-07-24 MED ORDER — FAMOTIDINE 20 MG PO TABS
20.0000 mg | ORAL_TABLET | Freq: Once | ORAL | Status: AC
  Administered 2016-07-24: 20 mg via ORAL

## 2016-07-24 MED ORDER — LIDOCAINE HCL (CARDIAC) 20 MG/ML IV SOLN
INTRAVENOUS | Status: DC | PRN
  Administered 2016-07-24: 40 mg via INTRAVENOUS

## 2016-07-24 MED ORDER — FENTANYL CITRATE (PF) 100 MCG/2ML IJ SOLN
INTRAMUSCULAR | Status: DC | PRN
  Administered 2016-07-24 (×2): 50 ug via INTRAVENOUS

## 2016-07-24 MED ORDER — KETOROLAC TROMETHAMINE 30 MG/ML IJ SOLN
INTRAMUSCULAR | Status: AC
  Filled 2016-07-24: qty 1

## 2016-07-24 MED ORDER — CEFAZOLIN SODIUM-DEXTROSE 2-4 GM/100ML-% IV SOLN
INTRAVENOUS | Status: AC
  Administered 2016-07-24: 2 g via INTRAVENOUS
  Filled 2016-07-24: qty 100

## 2016-07-24 MED ORDER — ONDANSETRON HCL 4 MG/2ML IJ SOLN
INTRAMUSCULAR | Status: DC | PRN
Start: 2016-07-24 — End: 2016-07-24
  Administered 2016-07-24: 4 mg via INTRAVENOUS

## 2016-07-24 MED ORDER — PROPOFOL 10 MG/ML IV BOLUS
INTRAVENOUS | Status: DC | PRN
  Administered 2016-07-24: 150 mg via INTRAVENOUS

## 2016-07-24 MED ORDER — PROPOFOL 10 MG/ML IV BOLUS
INTRAVENOUS | Status: AC
  Filled 2016-07-24: qty 20

## 2016-07-24 MED ORDER — BUPIVACAINE HCL (PF) 0.5 % IJ SOLN
INTRAMUSCULAR | Status: AC
  Filled 2016-07-24: qty 30

## 2016-07-24 MED ORDER — OXYCODONE HCL 5 MG/5ML PO SOLN: 5.0000 mg | Freq: Once | ORAL | Status: DC | PRN

## 2016-07-24 MED ORDER — OXYCODONE HCL 5 MG PO TABS: 5.0000 mg | ORAL_TABLET | Freq: Once | ORAL | Status: DC | PRN

## 2016-07-24 MED ORDER — FENTANYL CITRATE (PF) 100 MCG/2ML IJ SOLN
25.0000 ug | INTRAMUSCULAR | Status: DC | PRN
  Administered 2016-07-24 (×2): 25 ug via INTRAVENOUS

## 2016-07-24 MED ORDER — SUGAMMADEX SODIUM 200 MG/2ML IV SOLN
INTRAVENOUS | Status: DC | PRN
  Administered 2016-07-24: 200 mg via INTRAVENOUS

## 2016-07-24 MED ORDER — MIDAZOLAM HCL 2 MG/2ML IJ SOLN
INTRAMUSCULAR | Status: DC | PRN
  Administered 2016-07-24 (×2): 1 mg via INTRAVENOUS

## 2016-07-24 MED ORDER — HYDROCODONE-ACETAMINOPHEN 5-325 MG PO TABS
1.0000 | ORAL_TABLET | Freq: Four times a day (QID) | ORAL | Status: DC | PRN
  Administered 2016-07-24: 1 via ORAL

## 2016-07-24 MED ORDER — ROCURONIUM BROMIDE 50 MG/5ML IV SOSY
PREFILLED_SYRINGE | INTRAVENOUS | Status: AC
  Filled 2016-07-24: qty 5

## 2016-07-24 MED ORDER — HYDROCODONE-ACETAMINOPHEN 5-325 MG PO TABS: 1.0000 | ORAL_TABLET | Freq: Four times a day (QID) | ORAL | 0 refills | Status: DC | PRN

## 2016-07-24 MED ORDER — KETOROLAC TROMETHAMINE 30 MG/ML IJ SOLN
INTRAMUSCULAR | Status: DC | PRN
  Administered 2016-07-24: 30 mg via INTRAVENOUS

## 2016-07-24 SURGICAL SUPPLY — 45 items
BLADE SURG 15 STRL LF DISP TIS (BLADE) ×1 IMPLANT
BLADE SURG 15 STRL SS (BLADE) ×2
CANISTER SUCT 1200ML W/VALVE (MISCELLANEOUS) ×3 IMPLANT
CHLORAPREP W/TINT 26ML (MISCELLANEOUS) ×3 IMPLANT
CLOSURE WOUND 1/2 X4 (GAUZE/BANDAGES/DRESSINGS)
CNTNR SPEC 2.5X3XGRAD LEK (MISCELLANEOUS)
CONT SPEC 4OZ STER OR WHT (MISCELLANEOUS)
CONTAINER SPEC 2.5X3XGRAD LEK (MISCELLANEOUS) IMPLANT
DRAIN PENROSE 1/4X12 LTX (DRAIN) ×3 IMPLANT
DRAIN PENROSE 5/8X12 LTX STRL (DRAIN) ×3 IMPLANT
DRAPE LAPAROTOMY 77X122 PED (DRAPES) ×3 IMPLANT
DRESSING TELFA 4X3 1S ST N-ADH (GAUZE/BANDAGES/DRESSINGS) IMPLANT
DRSG TEGADERM 4X4.75 (GAUZE/BANDAGES/DRESSINGS) IMPLANT
ELECT REM PT RETURN 9FT ADLT (ELECTROSURGICAL) ×3
ELECTRODE REM PT RTRN 9FT ADLT (ELECTROSURGICAL) ×1 IMPLANT
GAUZE FLUFF 18X24 1PLY STRL (GAUZE/BANDAGES/DRESSINGS) ×3 IMPLANT
GLOVE BIO SURGEON STRL SZ 6.5 (GLOVE) ×2 IMPLANT
GLOVE BIO SURGEONS STRL SZ 6.5 (GLOVE) ×1
GLOVE BIOGEL PI IND STRL 6.5 (GLOVE) ×1 IMPLANT
GLOVE BIOGEL PI INDICATOR 6.5 (GLOVE) ×2
GOWN STRL REUS W/ TWL LRG LVL3 (GOWN DISPOSABLE) ×2 IMPLANT
GOWN STRL REUS W/TWL LRG LVL3 (GOWN DISPOSABLE) ×4
KIT RM TURNOVER STRD PROC AR (KITS) ×3 IMPLANT
LABEL OR SOLS (LABEL) ×3 IMPLANT
LIQUID BAND (GAUZE/BANDAGES/DRESSINGS) ×3 IMPLANT
NEEDLE HYPO 25GX1X1/2 BEV (NEEDLE) ×3 IMPLANT
PACK BASIN MINOR ARMC (MISCELLANEOUS) ×3 IMPLANT
SPONGE KITTNER 5P (MISCELLANEOUS) IMPLANT
STRIP CLOSURE SKIN 1/2X4 (GAUZE/BANDAGES/DRESSINGS) IMPLANT
SUPPORETR ATHLETIC LG (MISCELLANEOUS) ×1 IMPLANT
SUPPORTER ATHLETIC LG (MISCELLANEOUS) ×3
SUT CHROMIC GUT BROWN 0 54 (SUTURE) IMPLANT
SUT CHROMIC GUT BROWN 0 54IN (SUTURE)
SUT MNCRL 4-0 (SUTURE) ×2
SUT MNCRL 4-0 27XMFL (SUTURE) ×1
SUT SILK 0 SH 30 (SUTURE) ×6 IMPLANT
SUT SILK 2 0 SH (SUTURE) ×3 IMPLANT
SUT VIC AB 2-0 BRD 54 (SUTURE) ×3 IMPLANT
SUT VIC AB 3-0 SH 27 (SUTURE) ×2
SUT VIC AB 3-0 SH 27X BRD (SUTURE) ×1 IMPLANT
SUT VIC AB 4-0 FS2 27 (SUTURE) ×3 IMPLANT
SUTURE MNCRL 4-0 27XMF (SUTURE) ×1 IMPLANT
SYR 20CC LL (SYRINGE) ×3 IMPLANT
SYRINGE 10CC LL (SYRINGE) IMPLANT
WATER STERILE IRR 1000ML POUR (IV SOLUTION) ×3 IMPLANT

## 2016-07-24 NOTE — H&P (View-Only) (Signed)
07/20/2016 10:58 AM   Ronald Lang [birth year] [id number]  Referring provider: [facility] [place], [place] [address]  Chief Complaint  Patient presents with  . Testicle Pain    HPI: 61 year old male previously seen in the emergency room on 07/15/16 with several days of right testicular pain and swelling. He denies any precipitating trauma or associated symptoms including any urinary issues.  Scrotal ultrasound shows a concerning heterogeneous area measuring 4.2 x 2.9 x 3.4 cm within the testicle itself concerning for possible neoplasm. Tumor markers were obtained in the emergency room including AFP, beta-hCG, and LDH which are negative.  He denies a personal history of epididymoorchitis or urinary tract infections. He does not do testicular self exams regularly.  He denies any weight loss or bone pain. No night sweats.  The pain in his right testicle has essentially subsided and he now has only a residual mass.  Incidentally, urine culture from the emergency room grew 60,000 colonies of enterococcus. He was treated with Levaquin appropriately.  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  He takes no blood thinners or antiplatelet therapy.   PMH: Past Medical History:  Diagnosis Date  . Hypertension     Surgical History: No past surgical history on file.  Home Medications:  Allergies as of 07/20/2016   No Known Allergies     Medication List       Accurate as of 07/20/16 10:58 AM. Always use your most recent med list.          HYDROcodone-acetaminophen 5-325 MG tablet Commonly known as:  NORCO Take 1 tablet by mouth every 4 (four) hours as needed for moderate pain.   levofloxacin 500 MG tablet Commonly known as:  LEVAQUIN Take 1 tablet (500 mg total) by mouth daily.   naproxen 500 MG tablet Commonly known as:  NAPROSYN Take 1 tablet (500 mg total) by mouth 2 (two) times daily with a meal.       Allergies: No Known  Allergies  Family History: Family History  Problem Relation Age of Onset  . Prostate cancer Neg Hx   . Kidney cancer Neg Hx   . Bladder Cancer Neg Hx     Social History:  reports that he has been smoking.  He has never used smokeless tobacco. He reports that he does not drink alcohol or use drugs.  ROS: UROLOGY Frequent Urination?: No Hard to postpone urination?: No Burning/pain with urination?: No Get up at night to urinate?: No Leakage of urine?: No Urine stream starts and stops?: No Trouble starting stream?: No Do you have to strain to urinate?: No Blood in urine?: No Urinary tract infection?: No Sexually transmitted disease?: No Injury to kidneys or bladder?: No Painful intercourse?: No Weak stream?: No Erection problems?: No Penile pain?: No  Gastrointestinal Nausea?: No Vomiting?: No Indigestion/heartburn?: No Diarrhea?: No Constipation?: No  Constitutional Fever: No Night sweats?: No Weight loss?: Yes Fatigue?: No  Skin Skin rash/lesions?: No Itching?: No  Eyes Blurred vision?: No Double vision?: No  Ears/Nose/Throat Sore throat?: No Sinus problems?: No  Hematologic/Lymphatic Swollen glands?: No Easy bruising?: No  Cardiovascular Leg swelling?: No Chest pain?: No  Respiratory Cough?: No Shortness of breath?: No  Endocrine Excessive thirst?: No  Musculoskeletal Back pain?: No Joint pain?: No  Neurological Headaches?: No Dizziness?: No  Psychologic Depression?: No Anxiety?: No  Physical Exam: BP 132/73 (BP Location: Left Arm, Patient Position: Sitting, Cuff Size: Large)   Pulse 65  Ht 5\' 11"  (1.803 m)   Wt 201 lb 1.6 oz (91.2 kg)   BMI 28.05 kg/m   Constitutional:  Alert and oriented, No acute distress.  Accompanied by wife today. HEENT: [place] AT, moist mucus membranes.  Trachea midline, no masses. Cardiovascular: No clubbing, cyanosis, or edema. RRR. Respiratory: Normal respiratory effort, no increased work of breathing.  CTAB. GI: Abdomen is soft, nontender, nondistended, no abdominal masses GU: Circumcised phallus with orthotopic meatus. No penile discharge appreciated. Bilateral descended testicles, no scrotal skin changes or edema.  Left testicle and epididymis normal. Right testicle firm, nodular, with intratesticular lesion highly concerning for neoplasm, relatively nontender on exam. Skin: No rashes, bruises or suspicious lesions. Lymph: No cervical or inguinal adenopathy. Neurologic: Grossly intact, no focal deficits, moving all 4 extremities. Psychiatric: Normal mood and affect.  Laboratory Data: Lab Results  Component Value Date   WBC 9.5 07/15/2016   HGB 15.3 07/15/2016   HCT 44.4 07/15/2016   MCV 89.1 07/15/2016   PLT 259 07/15/2016    Lab Results  Component Value Date   CREATININE 0.96 07/15/2016   Component     Latest Ref Rng & Units 07/15/2016  Color, Urine     YELLOW YELLOW (A)  Appearance     CLEAR CLEAR (A)  Specific Gravity, Urine     1.005 - 1.030 1.015  pH     5.0 - 8.0 5.0  Glucose     NEGATIVE mg/dL NEGATIVE  Hgb urine dipstick     NEGATIVE SMALL (A)  Bilirubin Urine     NEGATIVE NEGATIVE  Ketones, ur     NEGATIVE mg/dL NEGATIVE  Protein     NEGATIVE mg/dL NEGATIVE  Nitrite     NEGATIVE NEGATIVE  Leukocytes, UA     NEGATIVE MODERATE (A)  RBC / HPF     0 - 5 RBC/hpf 0-5  WBC, UA     0 - 5 WBC/hpf 6-30  Bacteria, UA     NONE SEEN NONE SEEN  Squamous Epithelial / LPF     NONE SEEN NONE SEEN  Mucous      PRESENT   Urinalysis    Component Value Date/Time   COLORURINE YELLOW (A) 07/15/2016 1125   APPEARANCEUR CLEAR (A) 07/15/2016 1125   LABSPEC 1.015 07/15/2016 1125   PHURINE 5.0 07/15/2016 1125   GLUCOSEU NEGATIVE 07/15/2016 1125   HGBUR SMALL (A) 07/15/2016 1125   BILIRUBINUR NEGATIVE 07/15/2016 1125   KETONESUR NEGATIVE 07/15/2016 1125   PROTEINUR NEGATIVE 07/15/2016 1125   UROBILINOGEN 1.0 08/24/2010 0956   NITRITE NEGATIVE 07/15/2016 1125    LEUKOCYTESUR MODERATE (A) 07/15/2016 1125   Component     Latest Ref Rng & Units 07/15/2016  Specimen Description      URINE, CLEAN CATCH  Special Requests      NONE  Culture      60,000 COLONIES/mL ENTEROCOCCUS FAECALIS (A)  Report Status      07/17/2016 FINAL  Organism ID, Bacteria      ENTEROCOCCUS FAECALIS (A)    Pertinent Imaging: CLINICAL DATA:  Right testicular pain for 2 days with swelling  EXAM: ULTRASOUND OF SCROTUM  TECHNIQUE: Complete ultrasound examination of the testicles, epididymis, and other scrotal structures was performed.  COMPARISON:  None.  FINDINGS: Right testicle  Measurements: 3.7 x 2.3 x 3.1 cm. No intra testicular mass or microlithiasis visualized. Inferior to the right testicle within the right scrotum is a 4.2 x 2.9 x 3.4 cm heterogeneous area with color flow, the  area pain and swelling.  Left testicle  Measurements: 4.2 x 2.8 x 2.4 cm. No mass or microlithiasis visualized.  Right epididymis:  Normal in size and appearance.  Left epididymis:  Normal in size and appearance.  Hydrocele:  There are small bilateral hydroceles.  Varicocele:  None visualized.  IMPRESSION: Inferior to the right testicle within the right scrotum is a 4.2 x 2.9 x 3.4 cm masslike area with color flow, the area pain and swelling. Favor neoplasm, less likely hematoma given the extensive color flow.   Electronically Signed   By: Abelardo Diesel M.D.   On: 07/15/2016 12:19  Scrotal ultrasound personally reviewed today.  Assessment & Plan:  61 year old male with right testicular pain found to have a 4.2 cm lesion within the testicle highly concerning for neoplasm. Differential diagnosis includes focal epididymoorchitis, trouble with hemorrhage although these 2 etiologies are less likely. He did have a positive urine culture which was adequately treated, unclear if this was contributory to his symptoms.  1. Mass of right testicle  Given the  high index of suspicion for testicular cancer, I recommended proceeding with right inguinal approach radical orchiectomy. I explained that with possible that the lesion may represent alternative pathology is as above.  We reviewed the risks and benefits of orchiectomy. He was offered a prosthesis and declined. He is not interested in sperm banking at his age. Risks including bleeding, infection, phantom testicular pain, chronic pain, hematoma, discomfort were all discussed. All of his questions were answered.  We'll go ahead and arrange for staging in the form of chest x-ray and CT abdomen pelvis.  - CT Abdomen Pelvis W Contrast; Future - Chest 1 View; Future  Schedule right radical inguinal orchiectomy.  Hollice Espy, MD  [facility] [address] [place], [place] [address] [phone]

## 2016-07-24 NOTE — Interval H&P Note (Signed)
History and Physical Interval Note:  07/24/2016 9:04 AM  Ronald Lang  has presented today for surgery, with the diagnosis of RIGHT TESTICULAR MASS  The various methods of treatment have been discussed with the patient and family. After consideration of risks, benefits and other options for treatment, the patient has consented to  Procedure(s): ORCHIECTOMY (Right) as a surgical intervention .  The patient's history has been reviewed, patient examined, no change in status, stable for surgery.  I have reviewed the patient's chart and labs.  Questions were answered to the patient's satisfaction.     Hollice Espy

## 2016-07-24 NOTE — Discharge Instructions (Signed)
Orchiectomy, Care After Refer to this sheet in the next few weeks. These instructions provide you with information on caring for yourself after your procedure. Your health care provider may also give you more specific instructions. Your treatment has been planned according to current medical practices, but problems sometimes occur. Call your health care provider if you have any problems or questions after your procedure. WHAT TO EXPECT AFTER THE PROCEDURE A sterile dressing will be applied to the incision site. You may have a scrotal support. This elevates the scrotum, thereby relieving pressure on the surgical site. In those cases where the scrotal support irritates the incision site, you may be better with the support removed. It is okay if the dressing comes off, especially at night. Air will help a scab to form, which will eliminate the need for dressings during the day. HOME CARE INSTRUCTIONS  Your sterile dressing may be changed once per day or as instructed by your health care provider. If the dressing sticks to your incision site, you may use warm, soapy water or hydrogen peroxide to dampen the bandage. This will loosen the bandage from your skin so that it may be removed.  You may take showers the day after your procedure. Let the water pass gently over the surgery site. Do not rub the site. Pat the area gently or allow to air dry.  Avoid activities that may cause your incision to open.  Do not engage in sexual activity until the area is healed. Usually this will be in about 10-14 days.  Only take over-the-counter or prescription medicines for pain, discomfort, or fever as directed by your health care provider. SEEK MEDICAL CARE IF:  You experience increasing pain. SEEK IMMEDIATE MEDICAL CARE IF:  You have persistent dizziness or feel sick to your stomach (nausea).  You have difficulty staying awake or are unable to wake from sleeping.  You have difficulty breathing or a congested  cough.  You have a fever or shaking chills.  You have increasing pain or tenderness at the incision site.  You notice pus coming from the incision.  You notice a bad smell coming from the incision or dressing.  You cannot eat or drink or you develop nausea or vomiting.  You have constipation that is not helped by adjusting your diet or increasing fluid intake. Pain medicines are a common cause of constipation.  Your incision breaks open after the sutures have been removed.  You experience pain, swelling, or redness in your genital or groin area. This information is not intended to replace advice given to you by your health care provider. Make sure you discuss any questions you have with your health care provider. Document Released: 03/05/2013 Document Revised: 07/08/2013 Document Reviewed: 03/05/2013 Elsevier Interactive Patient Education  2017 [place]   1) The drugs that you were given will stay in your system until tomorrow so for the next 24 hours you should not:  A) Drive an automobile B) Make any legal decisions C) Drink any alcoholic beverage   2) You may resume regular meals tomorrow.  Today it is better to start with liquids and gradually work up to solid foods.  You may eat anything you prefer, but it is better to start with liquids, then soup and crackers, and gradually work up to solid foods.   3) Please notify your doctor immediately if you have any unusual bleeding, trouble breathing, redness and pain at the surgery site, drainage,  fever, or pain not relieved by medication.    4) Additional Instructions:        Please contact your physician with any problems or Same Day Surgery at [phone], Monday through Friday 6 am to 4 pm, or [place] at [facility] number at [phone].

## 2016-07-24 NOTE — Anesthesia Procedure Notes (Signed)
Procedure Name: LMA Insertion Date/Time: 07/24/2016 9:45 AM Performed by: Allean Found Pre-anesthesia Checklist: Patient identified, Emergency Drugs available, Suction available, Patient being monitored and Timeout performed Patient Re-evaluated:Patient Re-evaluated prior to inductionOxygen Delivery Method: Circle system utilized Preoxygenation: Pre-oxygenation with 100% oxygen Intubation Type: IV induction Ventilation: Mask ventilation without difficulty Laryngoscope Size: Mac and 4 Grade View: Grade I Tube type: Oral Tube size: 7.0 mm Number of attempts: 1 Placement Confirmation: ETT inserted through vocal cords under direct vision,  positive ETCO2 and breath sounds checked- equal and bilateral Secured at: 24 cm Tube secured with: Tape Dental Injury: Teeth and Oropharynx as per pre-operative assessment

## 2016-07-24 NOTE — Op Note (Signed)
Preoperative diagnosis:  1. Right testicular mass  Postoperative diagnosis:  1. Right testicular mass  Procedure: 1. Right radical orchiectomy (inguinal approach)  Surgeon: Hollice Espy, MD  Anesthesia: General  Complications: None  Intraoperative findings: Firm right testicular mass palpable.  Neovascularity surrounding abnormal portion of tumor, significantly adherent to gubernaculum.  EBL: Minimal  Specimens: Right testicle testicle  Drains: None  Indication: This is a 61 year old y.o. patient with right testicular mass which was confirmed on ultrasound highly suspicious for testicular cancer. Staging workup with CT  abdomen and pelvis along with tumor markers were negative. He is counseled to undergo right radical inguinal orchiectomy. After reviewing the management options for treatment, he elected to proceed with the above surgical procedure(s). We have discussed the potential benefits and risks of the procedure, side effects of the proposed treatment, the likelihood of the patient achieving the goals of the procedure, and any potential problems that might occur during the procedure or recuperation. Informed consent has been obtained.  Description of procedure:  The patient was taken to the operating room and general anesthesia was induced. The patient was placed in the supine position, shaved, prepped and draped in the usual sterile fashion, and preoperative antibiotics were administered. A preoperative time-out was performed.   At this point in time, an approximately 4 cm right subinguinal incision was created transversely using a 15 blade. Prior to making the incision, I did do physical exam to confirm that the right testicular was the abnormal testicle. In addition, I did place half percent Marcaine plain at the site of the planned incision. Incision was carried down through the subcutaneous tissues using Bovie electrocautery until the cord structures and abdominal wall  were identified. The cord was gently lifted using a Babcock and a plane was created underneath the cord by elevating it. A Penrose drain was then wrapped circumferentially around the cord for hemostatic control. The right testicle was then delivered into the field by applying traction on the cord and everting the right hemiscrotum. The gubernaculum was then obliterated using a combination of Bovie electrocautery along with chromic ties.  Of note, there was significant neovascularity here with ectatic vessels appreciated. In addition, the gubernaculum was quite thick, and adherent to the mass within the testicle concerning for involvement of least the tunica vaginalis. Once this was freed, the cord was dissected out proximally to the external ring. The ilioinguinal nerve was identified and care taken to avoid any damage or injury to the structure. The overlying external oblique fascia was incised towards the internal ring and the right cord was freed and additional several centimeters proximal. A large Claiborne Billings x2  was then placed on the cord at the proximal most location at the level of the right internal ring (cord divided into two packets). Another Claiborne Billings was placed more distally. The cord was then transected and the testicle was passed off the field as right testicle. The cord stump was ligated using 2-0 silk 2. An additional tie was placed across the entire cord stump and along tail was left using silk suture as a tag for later identification as needed. The cord remnant was then dunked into the internal ring. The overlying external oblique fascia was closed using a running 3-0 Vicryl suture again with care taken to avoid any injury to the ilioinguinal nerve. The wound was then irrigated. The scrotum was everted to ensure that there was no residual bleeding of the gubernaculum. Once hemostasis was adequate, the wound was closed in 2 layers  using a 3-0 Vicryl suture in a simple interrupted fashion to close the  Scarpa's fascia followed by a 4-0 Monocryl subcuticular suture. The wound was then cleaned and dried. Dermabond was applied to the incision site. The scrotal fluffs and a scrotal support devices were applied. He was then reversed from anesthesia and taken to the PACU in stable condition.  Plan: patient will follow-up in one week to review pathology results.  He will obtain CT of his abdomen/pelvis into secondary prior to this.  Hollice Espy, M.D.

## 2016-07-24 NOTE — Transfer of Care (Signed)
Immediate Anesthesia Transfer of Care Note  Patient: Ronald Lang  Procedure(s) Performed: Procedure(s): ORCHIECTOMY (Right)  Patient Location: PACU  Anesthesia Type:General  Level of Consciousness: sedated  Airway & Oxygen Therapy: Patient Spontanous Breathing and Patient connected to face mask oxygen  Post-op Assessment: Report given to RN and Post -op Vital signs reviewed and stable  Post vital signs: Reviewed and stable  Last Vitals:  Vitals:   07/24/16 0819 07/24/16 1057  BP: (!) 154/84   Pulse: (!) 59   Resp: 18   Temp: 37.1 C (P) 36.2 C    Last Pain:  Vitals:   07/24/16 0819  TempSrc: Tympanic         Complications: No apparent anesthesia complications

## 2016-07-24 NOTE — Anesthesia Preprocedure Evaluation (Signed)
Anesthesia Evaluation  Patient identified by MRN, date of birth, ID band Patient awake    Reviewed: Allergy & Precautions, H&P , NPO status , Patient's Chart, lab work & pertinent test results  History of Anesthesia Complications Negative for: history of anesthetic complications  Airway Mallampati: III  TM Distance: <3 FB Neck ROM: full    Dental no notable dental hx. (+) Poor Dentition, Chipped, Missing, Edentulous Upper   Pulmonary neg shortness of breath, former smoker,    Pulmonary exam normal breath sounds clear to auscultation       Cardiovascular Exercise Tolerance: Good hypertension, (-) angina(-) Past MI and (-) DOE Normal cardiovascular exam Rhythm:regular Rate:Normal     Neuro/Psych CVA (wordfinding problems), Residual Symptoms negative psych ROS   GI/Hepatic Neg liver ROS, GERD  Controlled,  Endo/Other  negative endocrine ROS  Renal/GU      Musculoskeletal  (+) Arthritis ,   Abdominal   Peds  Hematology negative hematology ROS (+)   Anesthesia Other Findings Past Medical History: No date: Arthritis No date: Hyperlipidemia No date: Hypertension 2015: Stroke [facility])  History reviewed. No pertinent surgical history.  BMI    Body Mass Index:  29.43 kg/m      Reproductive/Obstetrics negative OB ROS                             Anesthesia Physical Anesthesia Plan  ASA: III  Anesthesia Plan: General ETT   Post-op Pain Management:    Induction:   Airway Management Planned:   Additional Equipment:   Intra-op Plan:   Post-operative Plan:   Informed Consent: I have reviewed the patients History and Physical, chart, labs and discussed the procedure including the risks, benefits and alternatives for the proposed anesthesia with the patient or authorized representative who has indicated his/her understanding and acceptance.   Dental Advisory Given  Plan Discussed  with: Anesthesiologist, CRNA and Surgeon  Anesthesia Plan Comments:         Anesthesia Quick Evaluation

## 2016-07-25 LAB — SURGICAL PATHOLOGY

## 2016-07-26 NOTE — Anesthesia Postprocedure Evaluation (Signed)
Anesthesia Post Note  Patient: PERICLES CLUSTER  Procedure(s) Performed: Procedure(s) (LRB): ORCHIECTOMY (Right)  Patient location during evaluation: PACU Anesthesia Type: General Level of consciousness: awake and alert Pain management: pain level controlled Vital Signs Assessment: post-procedure vital signs reviewed and stable Respiratory status: spontaneous breathing, nonlabored ventilation, respiratory function stable and patient connected to nasal cannula oxygen Cardiovascular status: blood pressure returned to baseline and stable Postop Assessment: no signs of nausea or vomiting Anesthetic complications: no     Last Vitals:  Vitals:   07/24/16 1157 07/24/16 1227  BP: 118/79 (!) 144/76  Pulse: (!) 58 61  Resp: 18 18  Temp: 36.7 C     Last Pain:  Vitals:   07/25/16 1035  TempSrc:   PainSc: 0-No pain                 Precious Haws Yeshua Stryker

## 2016-07-31 ENCOUNTER — Telehealth: Payer: Self-pay | Admitting: Urology

## 2016-07-31 NOTE — Telephone Encounter (Signed)
Yes for wound check  Hollice Espy, MD

## 2016-07-31 NOTE — Telephone Encounter (Signed)
Do you still want him to keep the appt?

## 2016-07-31 NOTE — Telephone Encounter (Signed)
-----   Message from Hollice Espy, MD sent at 07/26/2016 11:03 AM EST ----- Pathology reviewed, benign by telephone.  Please cancel CT scans.  Follow up as scheduled.  Hollice Espy, MD

## 2016-08-08 ENCOUNTER — Ambulatory Visit (INDEPENDENT_AMBULATORY_CARE_PROVIDER_SITE_OTHER): Payer: PRIVATE HEALTH INSURANCE | Admitting: Urology

## 2016-08-08 ENCOUNTER — Encounter: Payer: Self-pay | Admitting: Urology

## 2016-08-08 ENCOUNTER — Ambulatory Visit: Payer: PRIVATE HEALTH INSURANCE | Admitting: Urology

## 2016-08-08 VITALS — BP 139/83 | HR 72 | Ht 71.0 in | Wt 195.0 lb

## 2016-08-08 DIAGNOSIS — N5089 Other specified disorders of the male genital organs: Secondary | ICD-10-CM

## 2016-08-08 DIAGNOSIS — N509 Disorder of male genital organs, unspecified: Secondary | ICD-10-CM

## 2016-08-08 NOTE — Progress Notes (Signed)
08/08/2016 3:47 PM   Ronald Lang [birth year] [id number]  Referring provider: [facility] [address] [place], [place] [address]  Chief Complaint  Patient presents with  . Routine Post Op    incision check    HPI:  61 year old male with right testicular mass highly concerning for malignancy. He underwent right inguinal radical orchiectomy on 07/24/2016. Fortunately, pathology is consistent with ruptured spermatocele with spermatic granuloma and resolving hemorrhage along with testicular parenchymal edema and atrophy secondary to inflammatory process. No malignancy was identified. Preoperative tumor markers were negative.  He returns today for wound check.  He denies any postoperative issues. He is healing well.    He does not recall and specific scrotal trauma but does perform manual labor frequently.     PMH: Past Medical History:  Diagnosis Date  . Arthritis   . Hyperlipidemia   . Hypertension   . Stroke [facility]) 2015    Surgical History: Past Surgical History:  Procedure Laterality Date  . ORCHIECTOMY Right 07/24/2016   Procedure: ORCHIECTOMY;  Surgeon: Hollice Espy, MD;  Location: [facility] ORS;  Service: Urology;  Laterality: Right;    Home Medications:  Allergies as of 08/08/2016   No Known Allergies     Medication List       Accurate as of 08/08/16  3:47 PM. Always use your most recent med list.          aspirin EC 81 MG tablet Take 81 mg by mouth daily.   lisinopril-hydrochlorothiazide 10-12.5 MG tablet Commonly known as:  PRINZIDE,ZESTORETIC Take 1 tablet by mouth daily.   pravastatin 20 MG tablet Commonly known as:  PRAVACHOL Take 20 mg by mouth every evening.       Allergies: No Known Allergies  Family History: Family History  Problem Relation Age of Onset  . Prostate cancer Neg Hx   . Kidney cancer Neg Hx   . Bladder Cancer Neg Hx     Social History:  reports that he has quit smoking. His smoking use included  Cigarettes. He smoked 2.00 packs per day. He has never used smokeless tobacco. He reports that he does not drink alcohol or use drugs.  ROS: UROLOGY Frequent Urination?: No Hard to postpone urination?: No Burning/pain with urination?: No Get up at night to urinate?: No Leakage of urine?: No Urine stream starts and stops?: No Trouble starting stream?: No Do you have to strain to urinate?: No Blood in urine?: No Urinary tract infection?: No Sexually transmitted disease?: No Injury to kidneys or bladder?: No Painful intercourse?: No Weak stream?: No Erection problems?: No Penile pain?: No  Gastrointestinal Nausea?: No Vomiting?: No Indigestion/heartburn?: No Diarrhea?: No Constipation?: No  Constitutional Fever: No Night sweats?: No Weight loss?: No Fatigue?: No  Skin Skin rash/lesions?: No Itching?: No  Eyes Blurred vision?: No Double vision?: No  Ears/Nose/Throat Sore throat?: No Sinus problems?: No  Hematologic/Lymphatic Swollen glands?: No Easy bruising?: No  Cardiovascular Leg swelling?: No Chest pain?: No  Respiratory Cough?: No Shortness of breath?: No  Endocrine Excessive thirst?: No  Musculoskeletal Back pain?: No Joint pain?: No  Neurological Headaches?: No Dizziness?: No  Psychologic Depression?: No Anxiety?: No  Physical Exam: BP 139/83   Pulse 72   Ht 5\' 11"  (1.803 m)   Wt 195 lb (88.5 kg)   BMI 27.20 kg/m   Constitutional:  Alert and oriented, No acute distress. HEENT: [place] AT, moist mucus membranes.  Trachea midline, no masses. Cardiovascular: No clubbing, cyanosis, or edema.  Respiratory: Normal respiratory effort, no increased work of breathing. GI: Abdomen is soft, nontender, nondistended, no abdominal masses.  RLQ incision healing well, mild underlying hematoma but no sign of infection.  No drainage or redness.   GU: No scrotal edema.  Slight fullness in right upper hemiscrotum consistent with hematoma, resolving.  No  skin changes.     Skin: No rashes, bruises or suspicious lesions. Lymph: Noinguinal adenopathy. Neurologic: Grossly intact, no focal deficits, moving all 4 extremities. Psychiatric: Normal mood and affect.  Laboratory Data: Lab Results  Component Value Date   WBC 9.5 07/15/2016   HGB 15.3 07/15/2016   HCT 44.4 07/15/2016   MCV 89.1 07/15/2016   PLT 259 07/15/2016    Lab Results  Component Value Date   CREATININE 0.96 07/15/2016    Urinalysis    Component Value Date/Time   COLORURINE YELLOW (A) 07/15/2016 1125   APPEARANCEUR CLEAR (A) 07/15/2016 1125   LABSPEC 1.015 07/15/2016 1125   PHURINE 5.0 07/15/2016 1125   GLUCOSEU NEGATIVE 07/15/2016 1125   HGBUR SMALL (A) 07/15/2016 1125   BILIRUBINUR NEGATIVE 07/15/2016 1125   [place] 07/15/2016 1125   PROTEINUR NEGATIVE 07/15/2016 1125   UROBILINOGEN 1.0 08/24/2010 0956   NITRITE NEGATIVE 07/15/2016 1125   LEUKOCYTESUR MODERATE (A) 07/15/2016 1125     Assessment & Plan:    1. Mass of right testicle S/p right radical orchiectomy with concern for malignancy Surgical pathology c/w rupture spermatocele with granuloma/ inflammation- reviewed path with patient and wife Wound appears to be healing well.   2 more weeks of light duty  Return if symptoms worsen or fail to improve.  Hollice Espy, MD  [facility] [address] [place], [place] [address] [phone]
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
## 2018-05-01 IMAGING — US US ART/VEN ABD/PELV/SCROTUM DOPPLER LTD
1 series · 14 of 25 positions shown · non-contrast
Comparison: None.

CLINICAL DATA: Right testicular pain for 2 days with swelling

EXAM:
ULTRASOUND OF SCROTUM
TECHNIQUE: Complete ultrasound examination of the testicles, epididymis, and
other scrotal structures was performed.

[Series 1: us art/ven abd/pelv/scrotum doppler ltd · 0.09mm/px · 14 of 76 slices shown]
[im 1/76]
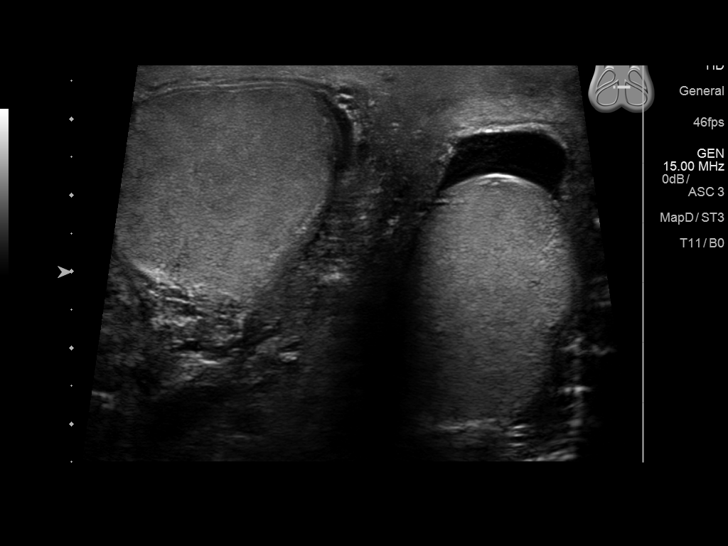
[im 7/76]
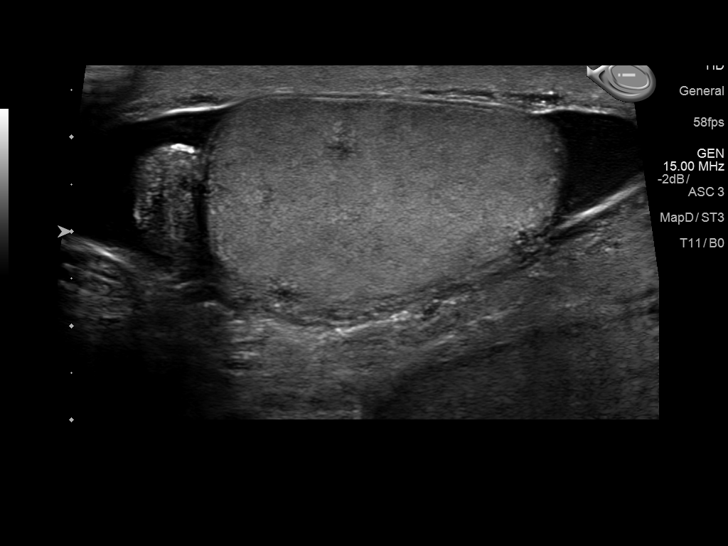
[im 13/76]
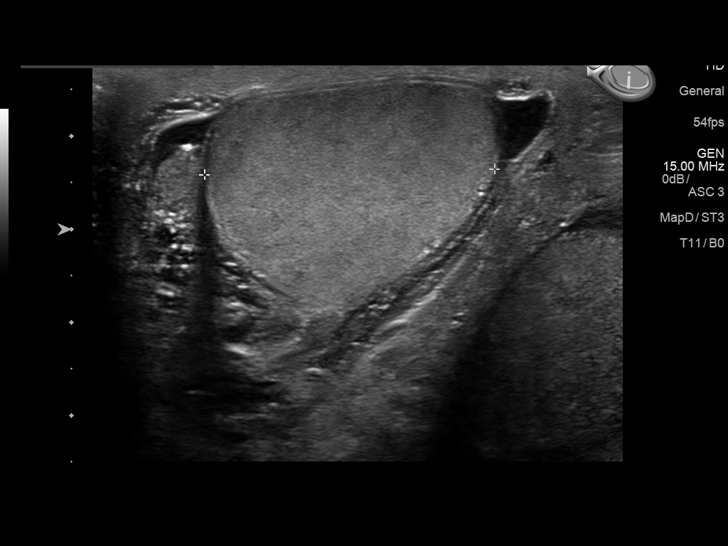
[im 19/76]
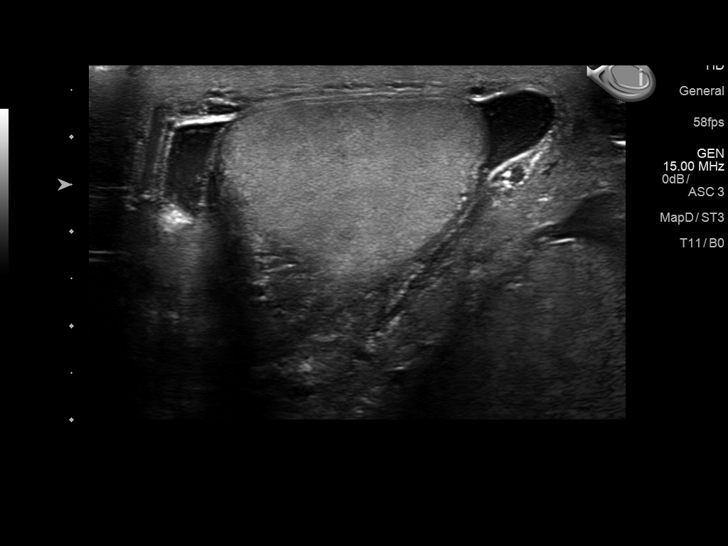
[im 26/76]
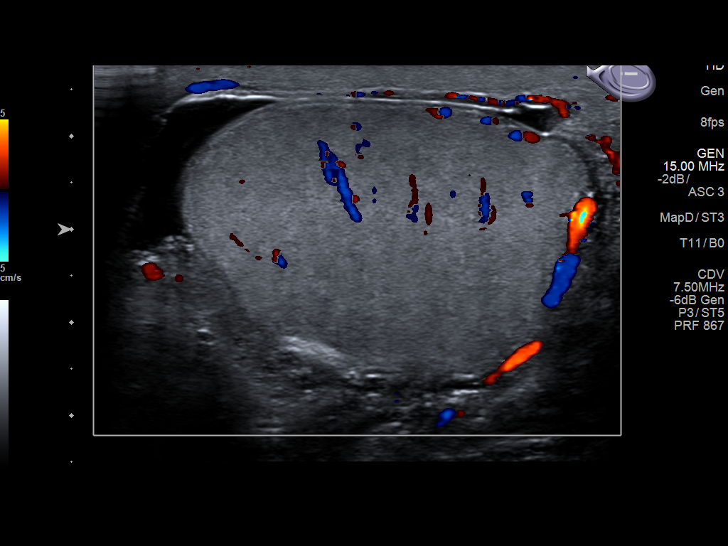
[im 29/76]
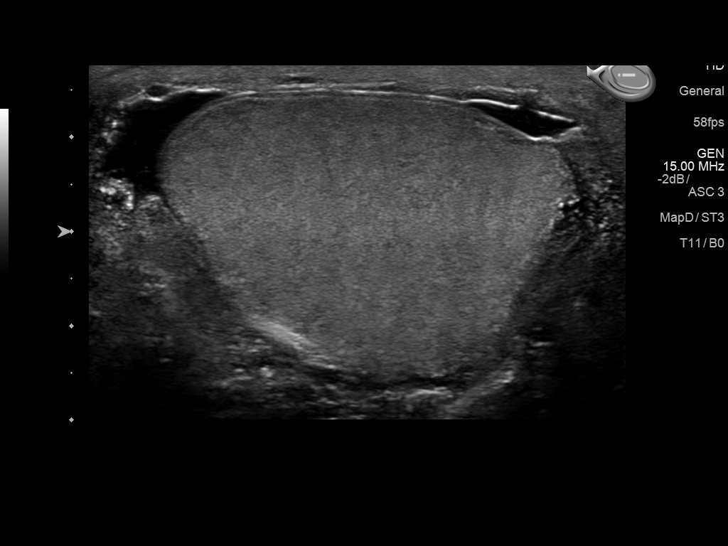
[im 35/76]
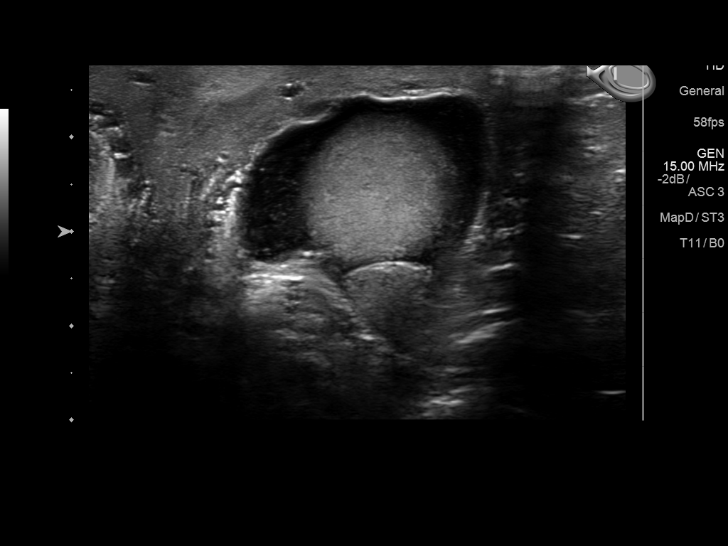
[im 41/76]
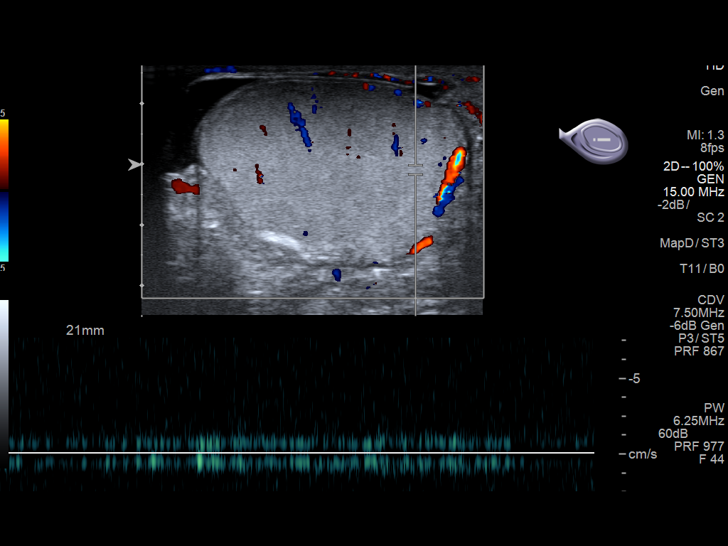
[im 47/76]
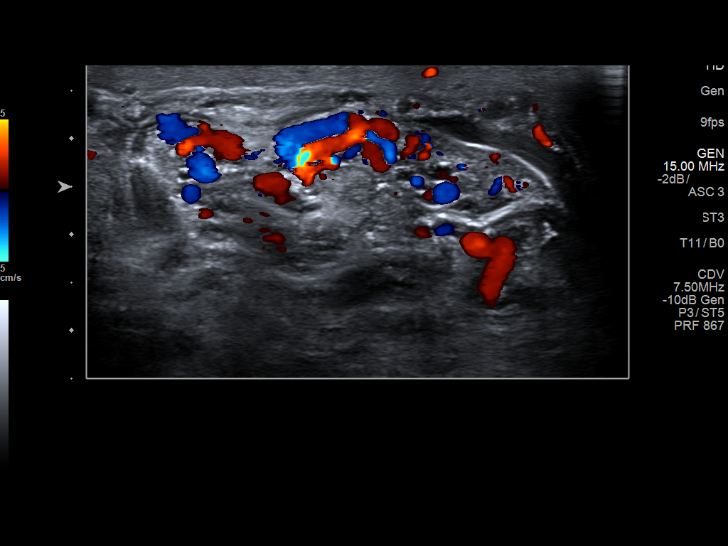
[im 51/76]
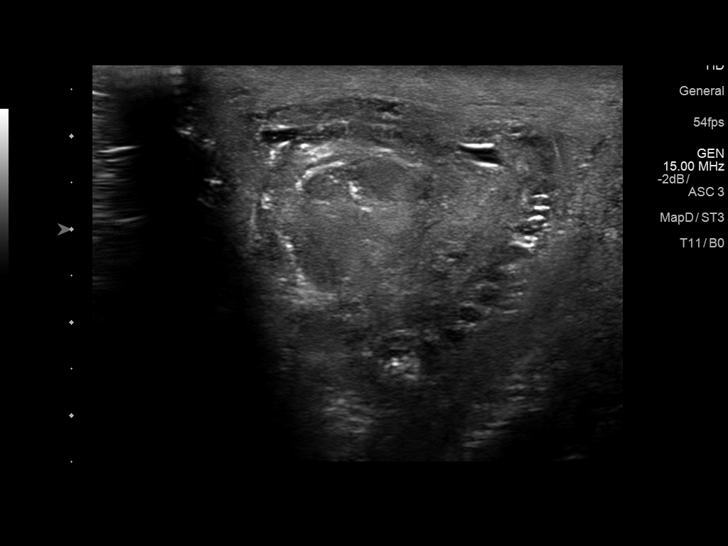
[im 57/76]
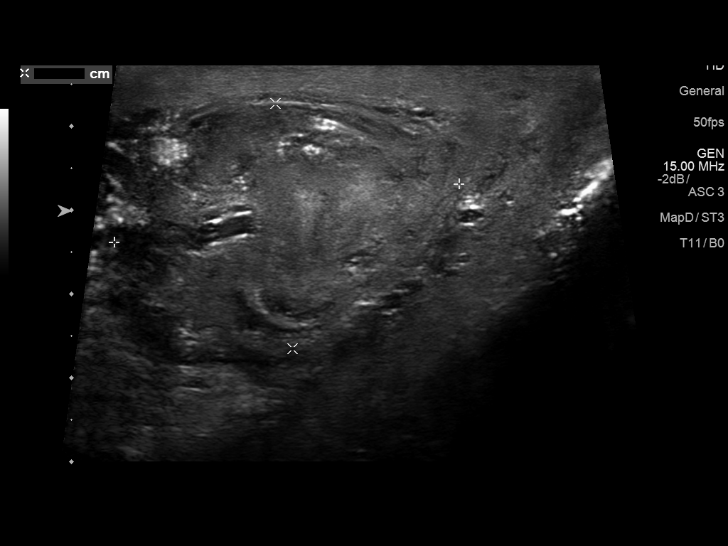
[im 63/76]
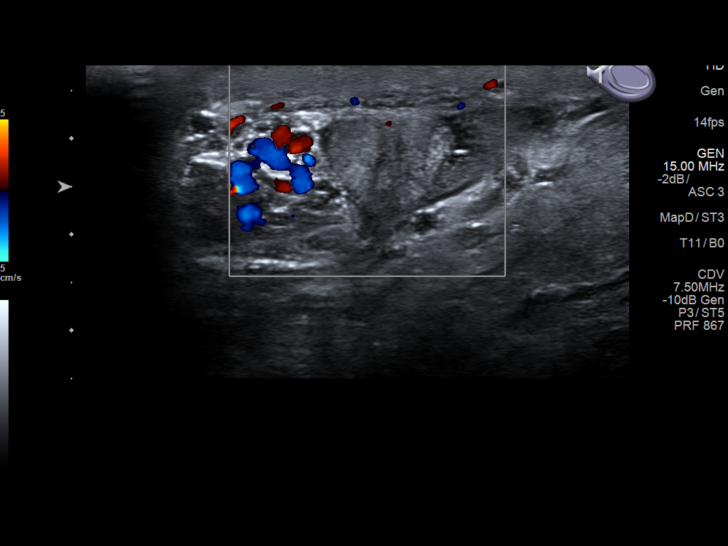
[im 69/76]
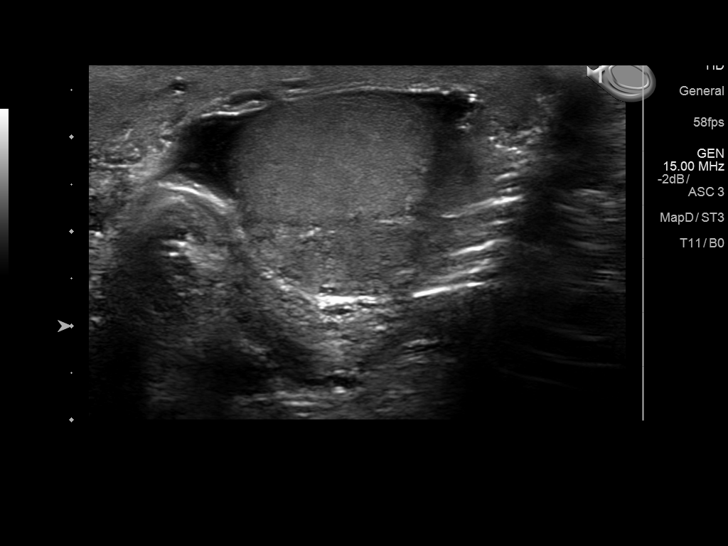
[im 76/76]
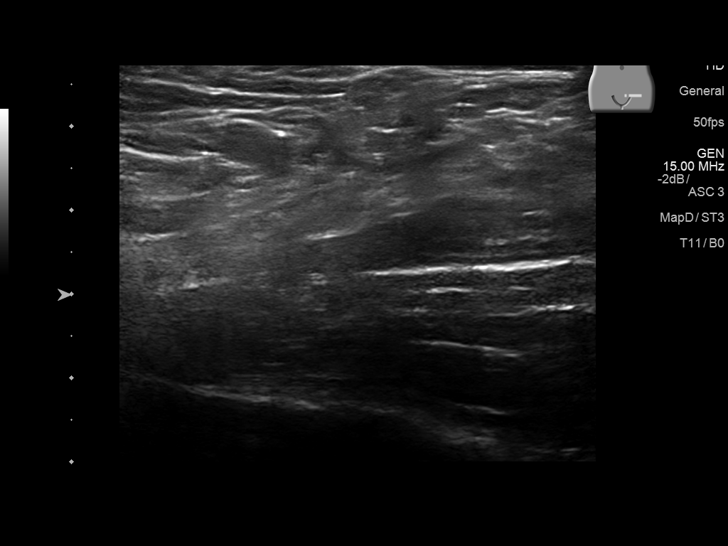

[14 of 25 positions shown; findings below may reference images not displayed]

FINDINGS: Right testicle

Measurements: 3.7 x 2.3 x 3.1 cm. No intra testicular mass or
microlithiasis visualized. Inferior to the right testicle within the
right scrotum is a 4.2 x 2.9 x 3.4 cm heterogeneous area with color
flow, the area pain and swelling.

Left testicle

Measurements: 4.2 x 2.8 x 2.4 cm. No mass or microlithiasis
visualized.

Right epididymis:  Normal in size and appearance.

Left epididymis:  Normal in size and appearance.

Hydrocele:  There are small bilateral hydroceles.

Varicocele:  None visualized.
IMPRESSION: Inferior to the right testicle within the right scrotum is a 4.2 x
2.9 x 3.4 cm masslike area with color flow, the area pain and
swelling. Favor neoplasm, less likely hematoma given the extensive
color flow.

## 2019-02-18 DIAGNOSIS — I10 Essential (primary) hypertension: Secondary | ICD-10-CM | POA: Diagnosis not present

## 2019-02-18 DIAGNOSIS — Z23 Encounter for immunization: Secondary | ICD-10-CM | POA: Diagnosis not present

## 2019-02-18 DIAGNOSIS — E782 Mixed hyperlipidemia: Secondary | ICD-10-CM | POA: Diagnosis not present

## 2019-02-18 DIAGNOSIS — Z0000 Encounter for general adult medical examination without abnormal findings: Secondary | ICD-10-CM | POA: Diagnosis not present

## 2019-02-18 DIAGNOSIS — Z125 Encounter for screening for malignant neoplasm of prostate: Secondary | ICD-10-CM | POA: Diagnosis not present

## 2019-02-18 DIAGNOSIS — Z8673 Personal history of transient ischemic attack (TIA), and cerebral infarction without residual deficits: Secondary | ICD-10-CM | POA: Diagnosis not present

## 2019-02-21 DIAGNOSIS — Z0000 Encounter for general adult medical examination without abnormal findings: Secondary | ICD-10-CM | POA: Diagnosis not present

## 2019-02-21 DIAGNOSIS — Z23 Encounter for immunization: Secondary | ICD-10-CM | POA: Diagnosis not present

## 2019-04-24 DIAGNOSIS — Z03818 Encounter for observation for suspected exposure to other biological agents ruled out: Secondary | ICD-10-CM | POA: Diagnosis not present

## 2019-08-21 DIAGNOSIS — E782 Mixed hyperlipidemia: Secondary | ICD-10-CM | POA: Diagnosis not present

## 2019-08-21 DIAGNOSIS — I10 Essential (primary) hypertension: Secondary | ICD-10-CM | POA: Diagnosis not present

## 2019-08-30 ENCOUNTER — Ambulatory Visit: Payer: PRIVATE HEALTH INSURANCE | Attending: Internal Medicine

## 2019-08-30 DIAGNOSIS — Z23 Encounter for immunization: Secondary | ICD-10-CM | POA: Insufficient documentation

## 2019-08-30 NOTE — Progress Notes (Signed)
   Covid-19 Vaccination Clinic  Name:  Ronald Lang    MRN: [id number] DOB: [birth year]  08/30/2019  [name] was observed post Covid-19 immunization for 15 minutes without incidence. He was provided with Vaccine Information Sheet and instruction to access the V-Safe system.   [name] was instructed to call 911 with any severe reactions post vaccine: Marland Kitchen Difficulty breathing  . Swelling of your face and throat  . A fast heartbeat  . A bad rash all over your body  . Dizziness and weakness    Immunizations Administered    Name Date Dose VIS Date Route   Pfizer COVID-19 Vaccine 08/30/2019 10:50 AM 0.3 mL 06/27/2019 Intramuscular   Manufacturer: [place]   Lot: [id number]   [place]: [id number]

## 2019-09-20 ENCOUNTER — Ambulatory Visit: Payer: PRIVATE HEALTH INSURANCE | Attending: Internal Medicine

## 2019-09-20 ENCOUNTER — Other Ambulatory Visit: Payer: Self-pay

## 2019-09-20 DIAGNOSIS — Z23 Encounter for immunization: Secondary | ICD-10-CM | POA: Insufficient documentation

## 2019-09-20 NOTE — Progress Notes (Signed)
   Covid-19 Vaccination Clinic  Name:  Ronald Lang    MRN: [id number] DOB: 15-Oct-1955  09/20/2019  [name] was observed post Covid-19 immunization for 15 minutes without incident. He was provided with Vaccine Information Sheet and instruction to access the V-Safe system.   [name] was instructed to call 911 with any severe reactions post vaccine: Marland Kitchen Difficulty breathing  . Swelling of face and throat  . A fast heartbeat  . A bad rash all over body  . Dizziness and weakness   Immunizations Administered    Name Date Dose VIS Date Route   Pfizer COVID-19 Vaccine 09/20/2019 10:11 AM 0.3 mL 06/27/2019 Intramuscular   Manufacturer: No Name   Lot: [id number]   [place]: [id number]

## 2020-03-29 DIAGNOSIS — Z23 Encounter for immunization: Secondary | ICD-10-CM | POA: Diagnosis not present

## 2020-05-17 DIAGNOSIS — E782 Mixed hyperlipidemia: Secondary | ICD-10-CM | POA: Diagnosis not present

## 2020-05-17 DIAGNOSIS — Z0000 Encounter for general adult medical examination without abnormal findings: Secondary | ICD-10-CM | POA: Diagnosis not present

## 2020-05-17 DIAGNOSIS — Z23 Encounter for immunization: Secondary | ICD-10-CM | POA: Diagnosis not present

## 2020-05-17 DIAGNOSIS — I10 Essential (primary) hypertension: Secondary | ICD-10-CM | POA: Diagnosis not present

## 2020-05-17 DIAGNOSIS — Z1211 Encounter for screening for malignant neoplasm of colon: Secondary | ICD-10-CM | POA: Diagnosis not present

## 2020-05-17 DIAGNOSIS — Z125 Encounter for screening for malignant neoplasm of prostate: Secondary | ICD-10-CM | POA: Diagnosis not present

## 2020-05-24 DIAGNOSIS — Z1211 Encounter for screening for malignant neoplasm of colon: Secondary | ICD-10-CM | POA: Diagnosis not present

## 2020-08-24 DIAGNOSIS — M25512 Pain in left shoulder: Secondary | ICD-10-CM | POA: Diagnosis not present

## 2020-08-27 DIAGNOSIS — M25512 Pain in left shoulder: Secondary | ICD-10-CM | POA: Diagnosis not present

## 2020-08-27 DIAGNOSIS — M7542 Impingement syndrome of left shoulder: Secondary | ICD-10-CM | POA: Diagnosis not present

## 2020-08-27 DIAGNOSIS — M778 Other enthesopathies, not elsewhere classified: Secondary | ICD-10-CM | POA: Diagnosis not present

## 2021-08-03 DIAGNOSIS — Z1211 Encounter for screening for malignant neoplasm of colon: Secondary | ICD-10-CM | POA: Diagnosis not present

## 2021-08-03 DIAGNOSIS — E782 Mixed hyperlipidemia: Secondary | ICD-10-CM | POA: Diagnosis not present

## 2021-08-03 DIAGNOSIS — Z0000 Encounter for general adult medical examination without abnormal findings: Secondary | ICD-10-CM | POA: Diagnosis not present

## 2021-08-03 DIAGNOSIS — I10 Essential (primary) hypertension: Secondary | ICD-10-CM | POA: Diagnosis not present

## 2021-08-03 DIAGNOSIS — Z125 Encounter for screening for malignant neoplasm of prostate: Secondary | ICD-10-CM | POA: Diagnosis not present

## 2021-08-10 DIAGNOSIS — H43813 Vitreous degeneration, bilateral: Secondary | ICD-10-CM | POA: Diagnosis not present

## 2021-09-15 DIAGNOSIS — Z1211 Encounter for screening for malignant neoplasm of colon: Secondary | ICD-10-CM | POA: Diagnosis not present

## 2022-03-13 DIAGNOSIS — M545 Low back pain, unspecified: Secondary | ICD-10-CM | POA: Diagnosis not present

## 2022-03-13 DIAGNOSIS — M5489 Other dorsalgia: Secondary | ICD-10-CM | POA: Diagnosis not present

## 2022-03-13 DIAGNOSIS — M47816 Spondylosis without myelopathy or radiculopathy, lumbar region: Secondary | ICD-10-CM | POA: Diagnosis not present

## 2022-04-28 DIAGNOSIS — Z23 Encounter for immunization: Secondary | ICD-10-CM | POA: Diagnosis not present

## 2022-08-04 DIAGNOSIS — I10 Essential (primary) hypertension: Secondary | ICD-10-CM | POA: Diagnosis not present

## 2022-08-04 DIAGNOSIS — Z0000 Encounter for general adult medical examination without abnormal findings: Secondary | ICD-10-CM | POA: Diagnosis not present

## 2022-08-04 DIAGNOSIS — R413 Other amnesia: Secondary | ICD-10-CM | POA: Diagnosis not present

## 2022-08-04 DIAGNOSIS — R7309 Other abnormal glucose: Secondary | ICD-10-CM | POA: Diagnosis not present

## 2022-08-04 DIAGNOSIS — Z1211 Encounter for screening for malignant neoplasm of colon: Secondary | ICD-10-CM | POA: Diagnosis not present

## 2022-08-04 DIAGNOSIS — E782 Mixed hyperlipidemia: Secondary | ICD-10-CM | POA: Diagnosis not present

## 2022-08-09 DIAGNOSIS — Z1211 Encounter for screening for malignant neoplasm of colon: Secondary | ICD-10-CM | POA: Diagnosis not present

## 2022-08-09 DIAGNOSIS — E538 Deficiency of other specified B group vitamins: Secondary | ICD-10-CM | POA: Diagnosis not present

## 2022-08-29 DIAGNOSIS — E538 Deficiency of other specified B group vitamins: Secondary | ICD-10-CM | POA: Diagnosis not present

## 2022-08-29 DIAGNOSIS — R739 Hyperglycemia, unspecified: Secondary | ICD-10-CM | POA: Diagnosis not present

## 2022-08-29 DIAGNOSIS — E7800 Pure hypercholesterolemia, unspecified: Secondary | ICD-10-CM | POA: Diagnosis not present

## 2022-08-29 DIAGNOSIS — Z114 Encounter for screening for human immunodeficiency virus [HIV]: Secondary | ICD-10-CM | POA: Diagnosis not present

## 2022-08-29 DIAGNOSIS — R413 Other amnesia: Secondary | ICD-10-CM | POA: Diagnosis not present

## 2022-08-29 DIAGNOSIS — I10 Essential (primary) hypertension: Secondary | ICD-10-CM | POA: Diagnosis not present

## 2022-08-30 ENCOUNTER — Other Ambulatory Visit: Payer: Self-pay | Admitting: Student

## 2022-08-30 DIAGNOSIS — R413 Other amnesia: Secondary | ICD-10-CM

## 2022-09-01 ENCOUNTER — Ambulatory Visit
Admission: RE | Admit: 2022-09-01 | Discharge: 2022-09-01 | Disposition: A | Discharge status: Home / Self Care | Payer: PPO | Source: Ambulatory Visit | Attending: Student | Admitting: Student

## 2022-09-01 DIAGNOSIS — R413 Other amnesia: Secondary | ICD-10-CM | POA: Insufficient documentation

## 2022-09-01 DIAGNOSIS — G319 Degenerative disease of nervous system, unspecified: Secondary | ICD-10-CM | POA: Diagnosis not present

## 2023-01-29 DIAGNOSIS — I10 Essential (primary) hypertension: Secondary | ICD-10-CM | POA: Diagnosis not present

## 2023-01-29 DIAGNOSIS — E782 Mixed hyperlipidemia: Secondary | ICD-10-CM | POA: Diagnosis not present

## 2023-01-29 DIAGNOSIS — R413 Other amnesia: Secondary | ICD-10-CM | POA: Diagnosis not present

## 2023-01-29 DIAGNOSIS — R7303 Prediabetes: Secondary | ICD-10-CM | POA: Diagnosis not present

## 2023-04-16 DIAGNOSIS — Z23 Encounter for immunization: Secondary | ICD-10-CM | POA: Diagnosis not present

## 2023-04-16 DIAGNOSIS — M5489 Other dorsalgia: Secondary | ICD-10-CM | POA: Diagnosis not present

## 2023-04-17 DIAGNOSIS — M810 Age-related osteoporosis without current pathological fracture: Secondary | ICD-10-CM | POA: Diagnosis not present

## 2023-09-11 DIAGNOSIS — E538 Deficiency of other specified B group vitamins: Secondary | ICD-10-CM | POA: Diagnosis not present

## 2023-09-11 DIAGNOSIS — E782 Mixed hyperlipidemia: Secondary | ICD-10-CM | POA: Diagnosis not present

## 2023-09-11 DIAGNOSIS — R7303 Prediabetes: Secondary | ICD-10-CM | POA: Diagnosis not present

## 2023-09-11 DIAGNOSIS — I10 Essential (primary) hypertension: Secondary | ICD-10-CM | POA: Diagnosis not present

## 2023-09-11 DIAGNOSIS — Z0000 Encounter for general adult medical examination without abnormal findings: Secondary | ICD-10-CM | POA: Diagnosis not present

## 2024-03-10 DIAGNOSIS — Z1211 Encounter for screening for malignant neoplasm of colon: Secondary | ICD-10-CM | POA: Diagnosis not present

## 2024-03-10 DIAGNOSIS — R7303 Prediabetes: Secondary | ICD-10-CM | POA: Diagnosis not present

## 2024-03-10 DIAGNOSIS — R899 Unspecified abnormal finding in specimens from other organs, systems and tissues: Secondary | ICD-10-CM | POA: Diagnosis not present

## 2024-03-10 DIAGNOSIS — E782 Mixed hyperlipidemia: Secondary | ICD-10-CM | POA: Diagnosis not present

## 2024-03-10 DIAGNOSIS — R413 Other amnesia: Secondary | ICD-10-CM | POA: Diagnosis not present

## 2024-03-10 DIAGNOSIS — Z125 Encounter for screening for malignant neoplasm of prostate: Secondary | ICD-10-CM | POA: Diagnosis not present

## 2024-03-10 DIAGNOSIS — R1084 Generalized abdominal pain: Secondary | ICD-10-CM | POA: Diagnosis not present

## 2024-03-10 DIAGNOSIS — I10 Essential (primary) hypertension: Secondary | ICD-10-CM | POA: Diagnosis not present

## 2024-03-12 ENCOUNTER — Other Ambulatory Visit: Payer: Self-pay | Admitting: Internal Medicine

## 2024-03-12 DIAGNOSIS — R1084 Generalized abdominal pain: Secondary | ICD-10-CM

## 2024-03-12 DIAGNOSIS — R17 Unspecified jaundice: Secondary | ICD-10-CM

## 2024-03-20 ENCOUNTER — Ambulatory Visit
Admission: RE | Admit: 2024-03-20 | Discharge: 2024-03-20 | Disposition: A | Discharge status: Home / Self Care | Source: Ambulatory Visit | Attending: Internal Medicine | Admitting: Internal Medicine

## 2024-03-20 DIAGNOSIS — R109 Unspecified abdominal pain: Secondary | ICD-10-CM | POA: Diagnosis not present

## 2024-03-20 DIAGNOSIS — Z9049 Acquired absence of other specified parts of digestive tract: Secondary | ICD-10-CM | POA: Diagnosis not present

## 2024-03-20 DIAGNOSIS — R1084 Generalized abdominal pain: Secondary | ICD-10-CM | POA: Insufficient documentation

## 2024-03-20 DIAGNOSIS — R17 Unspecified jaundice: Secondary | ICD-10-CM | POA: Insufficient documentation

## 2024-04-21 DIAGNOSIS — Z23 Encounter for immunization: Secondary | ICD-10-CM | POA: Diagnosis not present
